# Patient Record
Sex: Male | Born: 1945
Health system: Southern US, Community
[De-identification: ages and names within clinical notes are randomized; demographics above are authoritative.]

## PROBLEM LIST (undated history)

## (undated) DIAGNOSIS — J449 Chronic obstructive pulmonary disease, unspecified: Secondary | ICD-10-CM

## (undated) DIAGNOSIS — E782 Mixed hyperlipidemia: Secondary | ICD-10-CM

## (undated) DIAGNOSIS — I10 Essential (primary) hypertension: Secondary | ICD-10-CM

## (undated) DIAGNOSIS — Z6828 Body mass index (BMI) 28.0-28.9, adult: Secondary | ICD-10-CM

## (undated) DIAGNOSIS — R002 Palpitations: Secondary | ICD-10-CM

## (undated) DIAGNOSIS — J439 Emphysema, unspecified: Secondary | ICD-10-CM

## (undated) DIAGNOSIS — I700 Atherosclerosis of aorta: Secondary | ICD-10-CM

## (undated) DIAGNOSIS — N529 Male erectile dysfunction, unspecified: Secondary | ICD-10-CM

## (undated) DIAGNOSIS — I714 Abdominal aortic aneurysm, without rupture, unspecified: Secondary | ICD-10-CM

## (undated) DIAGNOSIS — F17200 Nicotine dependence, unspecified, uncomplicated: Secondary | ICD-10-CM

## (undated) DIAGNOSIS — I7143 Infrarenal abdominal aortic aneurysm, without rupture: Secondary | ICD-10-CM

## (undated) DIAGNOSIS — I7123 Aneurysm of the descending thoracic aorta, without rupture: Secondary | ICD-10-CM

## (undated) DIAGNOSIS — I739 Peripheral vascular disease, unspecified: Secondary | ICD-10-CM

## (undated) HISTORY — DX: Emphysema, unspecified: J43.9

## (undated) HISTORY — DX: Infrarenal abdominal aortic aneurysm, without rupture: I71.43

## (undated) HISTORY — PX: HAND SURGERY: SHX662

## (undated) HISTORY — DX: Essential (primary) hypertension: I10

## (undated) HISTORY — DX: Chronic obstructive pulmonary disease, unspecified: J44.9

## (undated) HISTORY — DX: Male erectile dysfunction, unspecified: N52.9

## (undated) HISTORY — DX: Palpitations: R00.2

## (undated) HISTORY — DX: Atherosclerosis of aorta: I70.0

## (undated) HISTORY — DX: Mixed hyperlipidemia: E78.2

## (undated) HISTORY — DX: Peripheral vascular disease, unspecified: I73.9

## (undated) HISTORY — DX: Body mass index (BMI) 28.0-28.9, adult: Z68.28

## (undated) HISTORY — PX: COLONOSCOPY: SHX174

---

## 2008-05-31 ENCOUNTER — Ambulatory Visit (HOSPITAL_BASED_OUTPATIENT_CLINIC_OR_DEPARTMENT_OTHER): Admission: RE | Admit: 2008-05-31 | Discharge: 2008-05-31 | Payer: Self-pay | Admitting: Orthopedic Surgery

## 2008-05-31 ENCOUNTER — Encounter (INDEPENDENT_AMBULATORY_CARE_PROVIDER_SITE_OTHER): Payer: Self-pay | Admitting: Orthopedic Surgery

## 2010-07-05 LAB — POCT I-STAT, CHEM 8
BUN: 15 mg/dL (ref 6–23)
Chloride: 107 mEq/L (ref 96–112)
Creatinine, Ser: 1 mg/dL (ref 0.4–1.5)
Glucose, Bld: 121 mg/dL — ABNORMAL HIGH (ref 70–99)
Hemoglobin: 13.3 g/dL (ref 13.0–17.0)
Potassium: 3.6 mEq/L (ref 3.5–5.1)
Sodium: 140 mEq/L (ref 135–145)

## 2010-08-07 NOTE — Op Note (Signed)
Brian Ibarra, Brian NO.:  [id number]   MEDICAL RECORD NO.:  [id number]          PATIENT TYPE:  AMB   LOCATION:  DSC                          FACILITY:  [facility]   PHYSICIAN:  Katy Fitch. Sypher, M.D. DATE OF BIRTH:  Dec 23, 1945   DATE OF PROCEDURE:  05/31/2008  DATE OF DISCHARGE:                               OPERATIVE REPORT   PREOPERATIVE DIAGNOSES:  Severe neglected Dupuytren contracture  involving all digits left hand with greater than 90 degrees flexion  contractures of the long, ring, and small finger metacarpophalangeal  joints, greater than 100 degrees flexion contractures of the proximal  interphalangeal joints of the ring and small fingers, and about 85  degrees flexion fracture of the proximal interphalangeal joint of the  long finger and extensive thumb index webspace disease as well as radial  side thumb disease extending from the interphalangeal joint proximal to  the metacarpal head.   POSTOPERATIVE DIAGNOSES:  Severe neglected Dupuytren contracture  involving all digits left hand with greater than 90 degrees flexion  contractures of the long, ring, and small finger metacarpophalangeal  joints, greater than 100 degrees flexion contractures of the proximal  interphalangeal joints of the ring and small fingers, and about 85  degrees flexion fracture of the proximal interphalangeal joint of the  long finger and extensive thumb index webspace disease as well as radial  side thumb disease extending from the interphalangeal joint proximal to  the metacarpal head with minor underlying osteoarthritis limiting the  ability to fully extend the proximal interphalangeal joints of the ring  and small fingers.   OPERATIONS:  Resection of profoundly difficult Dupuytren contracture  from left hand involving:  1: small finger/palm, 2:  ring finger/palm,  3:  long finger/palm, index pretendinous fibers in palm, 4:  thumb index  webspace, and 5:  radial aspect of  thumb.   OPERATING SURGEON:  Katy Fitch. Sypher, MD   ASSISTANT:  Marveen Reeks Dasnoit, PA-C   ANESTHESIA:  General by LMA supplemented by a left infraclavicular  block.   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  He is approximately 15 years status post resection  of a complex Dupuytren contracture from the right hand.  He is a  comitted smoker.  Despite counseling by his medical physicians and  myself, he elects to continue smoking.  He understands that this  aggravates healing of wounds and may exacerbate his Dupuytren diathesis.   Brian Ibarra has profound Dupuytren contracture.  I performed extensive  resection surgery of the right hand and pinned his IP joints of the ring  and small fingers to control flexion contractures.  Postoperatively,  despite a modicum of therapy, he has been left with extension  contractures.   Frustrated by this predicament, he postponed surgery for left hand until  he had a closed fist predicament involving the long, ring, and small  fingers.  He subsequently presented for a detailed hand surgery consult  and we had two lengthy informed consents  regarding surgery aftercare  risks and benefits.  I outlined to him that smoking can complicate  healing.  With his severe diathesis, he will likely have recurrence over  time.  It is unlikely with these severe flexion contractures that will  be able to fully extend his fingers.   Cognizant of all these issues, he accepts a palliative procedure at this  time fully understanding that we are using mechanical means to correct  his flexion contractures that are due to a biological process we cannot  control.  Preoperatively, he was provided IV antibiotics in the form of  Ancef 1 gram.  He was interviewed by Dr. Katrinka Blazing and after informed  consent, an infraclavicular block placed without complication.   Brian Ibarra was then brought to room 2 of the [facility] and  placed in supine position on the operating table.  Under Dr. Michaelle Copas  direct supervision, general anesthesia by LMA technique was induced  followed by routine Betadine scrub and paint of left upper extremity.  A  pneumatic tourniquet was applied to the proximal brachium.  Following  exsanguination of the arm with an Esmarch bandage, the arterial  tourniquet was inflated to 250 mmHg.  The procedure commenced with  planning with extensor Brunner zigzag incisions.  The full-length of the  small finger, ring finger into the palm, the long finger, separate  incision in the palm for the long finger pretendinous fibers, incision  for the index finger pretendinous fibers, thumb index, webspace  incision, and radial aspect of thumb incision.   All skin flaps were meticulously elevated off the dermis.  A meticulous  dissection using 2 tourniquet times over 2 hours and 11 minutes with the  first tourniquet and 45 minutes for the second tourniquet were required  to meticulously remove all the pathologic fascia visible.  We allowed a  30 minute reperfusion between tourniquet episodes.   We were able to fully correct the metacarpophalangeal joint flexion  contractures.  The small finger PIP contracture was reduced to less than  25 degrees, the ring finger to approximately 10 degrees, long finger to  5 degrees,  index finger fully released, and the thumb fully released.  The tourniquet was released and hemostasis was achieved after each  tourniquet fashion with bipolar cautery.  The wounds were irrigated.  After hemostasis was achieved, the wounds were rinsed with sterile  saline followed by closure of the wounds loosely with corner sutures of  5-0 nylon and multiple interrupted sutures of 5-0 nylon.   Voluminous dressings were applied with Silvadene, Adaptic, sterile  gauze, sterile Webril, and a volar plaster splint maintaining the   fingers in about 50% extension.  There was immediate capillary refill  upon release of the first and second tourniquets.  All digits were well  perfused with normal turgor.  There were no apparent complications.   For aftercare, Mr. Lucas Ibarra was once again encouraged not smoke.  He is  provided prescriptions for Keflex 500 mg one p.o. q.8 h. x4 days as  prophylactic antibiotic.  He is also provided Motrin 600 mg one p.o. q.6  h. p.r.n. pain with food and also Percocet 5 mg 1 p.o. q.4-6 hours  p.r.n. pain, 30 tablets without refill.   We will see back for followup in 1 week or sooner if problems.  Katy Fitch Sypher, M.D.  Electronically Signed     RVS/MEDQ  D:  05/31/2008  T:  06/01/2008  Job:  [id number]   cc:   Luna Kitchens, M.D.

## 2018-11-25 DIAGNOSIS — L602 Onychogryphosis: Secondary | ICD-10-CM | POA: Insufficient documentation

## 2018-11-25 HISTORY — DX: Onychogryphosis: L60.2

## 2019-05-26 DIAGNOSIS — K635 Polyp of colon: Secondary | ICD-10-CM | POA: Diagnosis not present

## 2019-05-26 DIAGNOSIS — D124 Benign neoplasm of descending colon: Secondary | ICD-10-CM | POA: Diagnosis not present

## 2019-05-26 DIAGNOSIS — D123 Benign neoplasm of transverse colon: Secondary | ICD-10-CM | POA: Diagnosis not present

## 2019-05-26 DIAGNOSIS — Z86010 Personal history of colonic polyps: Secondary | ICD-10-CM | POA: Diagnosis not present

## 2019-05-26 DIAGNOSIS — Z800 Family history of malignant neoplasm of digestive organs: Secondary | ICD-10-CM | POA: Diagnosis not present

## 2019-05-26 DIAGNOSIS — Z1211 Encounter for screening for malignant neoplasm of colon: Secondary | ICD-10-CM | POA: Diagnosis not present

## 2019-05-26 DIAGNOSIS — K5730 Diverticulosis of large intestine without perforation or abscess without bleeding: Secondary | ICD-10-CM | POA: Diagnosis not present

## 2019-09-05 DIAGNOSIS — Z716 Tobacco abuse counseling: Secondary | ICD-10-CM | POA: Diagnosis not present

## 2019-09-05 DIAGNOSIS — A681 Tick-borne relapsing fever: Secondary | ICD-10-CM | POA: Diagnosis not present

## 2019-09-05 DIAGNOSIS — W57XXXA Bitten or stung by nonvenomous insect and other nonvenomous arthropods, initial encounter: Secondary | ICD-10-CM | POA: Diagnosis not present

## 2019-09-05 DIAGNOSIS — Z20828 Contact with and (suspected) exposure to other viral communicable diseases: Secondary | ICD-10-CM | POA: Diagnosis not present

## 2019-09-05 DIAGNOSIS — J449 Chronic obstructive pulmonary disease, unspecified: Secondary | ICD-10-CM | POA: Diagnosis not present

## 2019-09-15 DIAGNOSIS — Z79899 Other long term (current) drug therapy: Secondary | ICD-10-CM | POA: Diagnosis not present

## 2019-09-15 DIAGNOSIS — Z125 Encounter for screening for malignant neoplasm of prostate: Secondary | ICD-10-CM | POA: Diagnosis not present

## 2019-09-15 DIAGNOSIS — E782 Mixed hyperlipidemia: Secondary | ICD-10-CM | POA: Diagnosis not present

## 2019-09-15 DIAGNOSIS — I739 Peripheral vascular disease, unspecified: Secondary | ICD-10-CM | POA: Diagnosis not present

## 2019-09-15 DIAGNOSIS — I10 Essential (primary) hypertension: Secondary | ICD-10-CM | POA: Diagnosis not present

## 2019-09-15 DIAGNOSIS — N529 Male erectile dysfunction, unspecified: Secondary | ICD-10-CM | POA: Diagnosis not present

## 2019-09-15 DIAGNOSIS — F17200 Nicotine dependence, unspecified, uncomplicated: Secondary | ICD-10-CM | POA: Diagnosis not present

## 2019-09-15 DIAGNOSIS — J439 Emphysema, unspecified: Secondary | ICD-10-CM | POA: Diagnosis not present

## 2019-09-15 DIAGNOSIS — I712 Thoracic aortic aneurysm, without rupture: Secondary | ICD-10-CM | POA: Diagnosis not present

## 2019-12-17 DIAGNOSIS — Z87891 Personal history of nicotine dependence: Secondary | ICD-10-CM | POA: Diagnosis not present

## 2019-12-17 DIAGNOSIS — I712 Thoracic aortic aneurysm, without rupture: Secondary | ICD-10-CM | POA: Diagnosis not present

## 2019-12-17 DIAGNOSIS — I700 Atherosclerosis of aorta: Secondary | ICD-10-CM | POA: Diagnosis not present

## 2019-12-17 DIAGNOSIS — F17210 Nicotine dependence, cigarettes, uncomplicated: Secondary | ICD-10-CM | POA: Diagnosis not present

## 2019-12-17 DIAGNOSIS — I2510 Atherosclerotic heart disease of native coronary artery without angina pectoris: Secondary | ICD-10-CM | POA: Diagnosis not present

## 2019-12-17 DIAGNOSIS — J432 Centrilobular emphysema: Secondary | ICD-10-CM | POA: Diagnosis not present

## 2020-01-11 ENCOUNTER — Encounter: Payer: Self-pay | Admitting: Vascular Surgery

## 2020-01-11 ENCOUNTER — Encounter: Payer: Self-pay | Admitting: *Deleted

## 2020-01-11 ENCOUNTER — Other Ambulatory Visit: Payer: Self-pay

## 2020-01-11 ENCOUNTER — Ambulatory Visit: Payer: Medicare PPO | Admitting: Vascular Surgery

## 2020-01-11 DIAGNOSIS — I712 Thoracic aortic aneurysm, without rupture: Secondary | ICD-10-CM

## 2020-01-11 DIAGNOSIS — I7123 Aneurysm of the descending thoracic aorta, without rupture: Secondary | ICD-10-CM | POA: Insufficient documentation

## 2020-01-11 NOTE — Progress Notes (Signed)
Patient name: Brian Ibarra MRN: [id number] DOB: 1946/03/22 Sex: male  REASON FOR CONSULT: Descending thoracic aneurysm  HPI: Brian Ibarra is a 74 y.o. male, with history of COPD, hypertension, tobacco abuse that presents as a referral for evaluation of a descending thoracic aortic aneurysm.  Patient states he has known about the aneurysm for at least the last 4 to 5 years.  This has been followed by his primary care doctor Dr. Welton Flakes in [place].  His most recent CT done at [facility] on 12/17/2019 suggest a 4.3 cm fusiform aneurysm of the proximal descending thoracic aorta that is unchanged.  Also posterior small focal penetrating PAU.  Patient denies any chest pain or back pain.  He is still smoking about a pack a day and has smoked for 60 years.  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  States he has had lower extremity angiogram with IR in [place] for claudication.  His legs do not bother him at this time.    Past Medical History:  Diagnosis Date  . COPD (chronic obstructive pulmonary disease) (HCC)   . Hypertension    PSH: None  Family History  Problem Relation Age of Onset  . Heart disease Mother   . Cancer Mother   . Cancer Father     SOCIAL HISTORY: Social History   Socioeconomic History  . Marital status: Divorced    Spouse name: Not on file  . Number of children: Not on file  . Years of education: Not on file  . Highest education level: Not on file  Occupational History  . Not on file  Tobacco Use  . Smoking status: Current Every Day Smoker    Types: Cigarettes  . Smokeless tobacco: Former Engineer, water and Sexual Activity  . Alcohol use: Yes  . Drug use: Never  . Sexual activity: Not on file  Other Topics Concern  . Not on file  Social History Narrative  . Not on file   Social Determinants of Health   Financial Resource Strain:   . Difficulty of Paying Living Expenses: Not on file  Food Insecurity:   . Worried About Programme researcher, broadcasting/film/video  in the Last Year: Not on file  . Ran Out of Food in the Last Year: Not on file  Transportation Needs:   . Lack of Transportation (Medical): Not on file  . Lack of Transportation (Non-Medical): Not on file  Physical Activity:   . Days of Exercise per Week: Not on file  . Minutes of Exercise per Session: Not on file  Stress:   . Feeling of Stress : Not on file  Social Connections:   . Frequency of Communication with Friends and Family: Not on file  . Frequency of Social Gatherings with Friends and Family: Not on file  . Attends Religious Services: Not on file  . Active Member of Clubs or Organizations: Not on file  . Attends Banker Meetings: Not on file  . Marital Status: Not on file  Intimate Partner Violence:   . Fear of Current or Ex-Partner: Not on file  . Emotionally Abused: Not on file  . Physically Abused: Not on file  . Sexually Abused: Not on file    No Known Allergies  Current Outpatient Medications  Medication Sig Dispense Refill  . albuterol (VENTOLIN HFA) 108 (90 Base) MCG/ACT inhaler INHALE TWO PUFFS FOUR TIMES DAILY AS NEEDED.    Marland Kitchen amLODipine-olmesartan (AZOR) 5-20 MG tablet Take 1 tablet  by mouth daily.    Marland Kitchen aspirin 81 MG EC tablet Take by mouth.    Marland Kitchen atorvastatin (LIPITOR) 20 MG tablet     . clopidogrel (PLAVIX) 75 MG tablet Take 75 mg by mouth daily.     No current facility-administered medications for this visit.    REVIEW OF SYSTEMS:  [X]  denotes positive finding, [ ]  denotes negative finding Cardiac  Comments:  Chest pain or chest pressure:    Shortness of breath upon exertion:    Short of breath when lying flat:    Irregular heart rhythm:        Vascular    Pain in calf, thigh, or hip brought on by ambulation:    Pain in feet at night that wakes you up from your sleep:     Blood clot in your veins:    Leg swelling:         Pulmonary    Oxygen at home:    Productive cough:     Wheezing:         Neurologic    Sudden weakness in  arms or legs:     Sudden numbness in arms or legs:     Sudden onset of difficulty speaking or slurred speech:    Temporary loss of vision in one eye:     Problems with dizziness:         Gastrointestinal    Blood in stool:     Vomited blood:         Genitourinary    Burning when urinating:     Blood in urine:        Psychiatric    Major depression:         Hematologic    Bleeding problems:    Problems with blood clotting too easily:        Skin    Rashes or ulcers:        Constitutional    Fever or chills:      PHYSICAL EXAM: Vitals:   01/11/20 1133  BP: (!) 181/98  Pulse: 77  Resp: 18  Temp: 98 F (36.7 C)  TempSrc: Temporal  SpO2: 94%  Weight: 213 lb (96.6 kg)  Height: 6\' 2"  (1.88 m)    GENERAL: The patient is a well-nourished male, in no acute distress. The vital signs are documented above. CARDIAC: There is a regular rate and rhythm.  VASCULAR:  Palpable femoral pulses bilateral groins Right DP 1+ palpable Unable to appreciate pedal pulses in the left but warm PULMONARY: No respiratory distress. ABDOMEN: Soft and non-tender with normal pitched bowel sounds.  MUSCULOSKELETAL: There are no major deformities or cyanosis. NEUROLOGIC: No focal weakness or paresthesias are detected. SKIN: There are no ulcers or rashes noted. PSYCHIATRIC: The patient has a normal affect.  DATA:   I do not have the images but per the report from [facility] there is a unchanged fusiform aneurysm of the proximal descending thoracic aorta measuring 4.3 cm.  Assessment/Plan:  74 year old male referred for a 4.3 cm aneurysm of the descending thoracic aorta.  I do not have the images but I have the report from [facility] at [facility] and states this is unchanged.  I have asked our office to get a copy of the images from [facility] so I can review the images.  Discussed current SVS guidelines are to repair greater than 5.5 cm in low surgical risk  patients and generally 6 cm in higher risk patients.  Discussed with the patient no indication for repair at this time and will follow.  I think can repeat a CTA chest in 1 year.  Also his wife is very concerned about other aneurysmal disease and discussed that with his smoking history would qualify for Medicare screening exam to rule out abdominal aortic aneurysm which I ordered.  I will contact him with results.   [name], MD [facility] [place] Office: [phone]

## 2020-01-17 ENCOUNTER — Telehealth: Payer: Self-pay | Admitting: *Deleted

## 2020-01-17 ENCOUNTER — Other Ambulatory Visit: Payer: Self-pay

## 2020-01-17 DIAGNOSIS — I7123 Aneurysm of the descending thoracic aorta, without rupture: Secondary | ICD-10-CM

## 2020-01-17 DIAGNOSIS — I712 Thoracic aortic aneurysm, without rupture: Secondary | ICD-10-CM

## 2020-01-17 NOTE — Telephone Encounter (Signed)
Pt called to inquire about where to get extra strength gas ex. Informed pt it can be found over the counter at most pharmacies and to ask the pharmacist if he is having trouble finding it.

## 2020-01-19 ENCOUNTER — Other Ambulatory Visit: Payer: Self-pay

## 2020-01-19 ENCOUNTER — Ambulatory Visit (HOSPITAL_COMMUNITY)
Admission: RE | Admit: 2020-01-19 | Discharge: 2020-01-19 | Disposition: A | Discharge status: Home / Self Care | Payer: Medicare PPO | Source: Ambulatory Visit | Attending: Vascular Surgery | Admitting: Vascular Surgery

## 2020-01-19 DIAGNOSIS — I10 Essential (primary) hypertension: Secondary | ICD-10-CM | POA: Insufficient documentation

## 2020-01-19 DIAGNOSIS — I712 Thoracic aortic aneurysm, without rupture: Secondary | ICD-10-CM | POA: Diagnosis not present

## 2020-01-19 DIAGNOSIS — Z136 Encounter for screening for cardiovascular disorders: Secondary | ICD-10-CM | POA: Diagnosis not present

## 2020-01-19 DIAGNOSIS — F17200 Nicotine dependence, unspecified, uncomplicated: Secondary | ICD-10-CM | POA: Insufficient documentation

## 2020-01-19 DIAGNOSIS — I7123 Aneurysm of the descending thoracic aorta, without rupture: Secondary | ICD-10-CM

## 2020-01-19 DIAGNOSIS — F17210 Nicotine dependence, cigarettes, uncomplicated: Secondary | ICD-10-CM | POA: Diagnosis not present

## 2020-02-01 ENCOUNTER — Encounter: Payer: Medicare PPO | Admitting: Vascular Surgery

## 2020-02-02 ENCOUNTER — Telehealth: Payer: Self-pay | Admitting: Vascular Surgery

## 2020-02-02 NOTE — Telephone Encounter (Signed)
Discussed with Brian Ibarra that his AAA screen did confirm a 4.5 cm abdominal aortic aneurysm.  We discussed that recommendation would be surveillance at this size and indication for repair greater than 5.5 cm unless there is rapid growth in the aneurysm or he starts having symptoms coming from the aneurysm.  Discussed I would plan to add on EVAR duplex in 1 year at the time that he scheduled to follow-up with me with CT chest for surveillance of thoracic aneurysm.  He would like to have a CT scan done in [place] which we will hopefully facilitate at his next appointment.  [name], MD [facility] [place] Office: [phone]   [name]

## 2020-03-19 DIAGNOSIS — B349 Viral infection, unspecified: Secondary | ICD-10-CM | POA: Diagnosis not present

## 2020-03-19 DIAGNOSIS — Z20828 Contact with and (suspected) exposure to other viral communicable diseases: Secondary | ICD-10-CM | POA: Diagnosis not present

## 2020-03-19 DIAGNOSIS — R051 Acute cough: Secondary | ICD-10-CM | POA: Diagnosis not present

## 2020-10-09 DIAGNOSIS — I10 Essential (primary) hypertension: Secondary | ICD-10-CM | POA: Diagnosis not present

## 2020-10-09 DIAGNOSIS — Z6827 Body mass index (BMI) 27.0-27.9, adult: Secondary | ICD-10-CM | POA: Diagnosis not present

## 2020-10-09 DIAGNOSIS — Z125 Encounter for screening for malignant neoplasm of prostate: Secondary | ICD-10-CM | POA: Diagnosis not present

## 2020-10-09 DIAGNOSIS — I739 Peripheral vascular disease, unspecified: Secondary | ICD-10-CM | POA: Diagnosis not present

## 2020-10-09 DIAGNOSIS — J439 Emphysema, unspecified: Secondary | ICD-10-CM | POA: Diagnosis not present

## 2020-10-09 DIAGNOSIS — F17200 Nicotine dependence, unspecified, uncomplicated: Secondary | ICD-10-CM | POA: Diagnosis not present

## 2020-10-09 DIAGNOSIS — Z79899 Other long term (current) drug therapy: Secondary | ICD-10-CM | POA: Diagnosis not present

## 2020-10-09 DIAGNOSIS — Z0000 Encounter for general adult medical examination without abnormal findings: Secondary | ICD-10-CM | POA: Diagnosis not present

## 2020-10-09 DIAGNOSIS — E782 Mixed hyperlipidemia: Secondary | ICD-10-CM | POA: Diagnosis not present

## 2020-10-20 DIAGNOSIS — Z6827 Body mass index (BMI) 27.0-27.9, adult: Secondary | ICD-10-CM | POA: Diagnosis not present

## 2020-10-20 DIAGNOSIS — M7022 Olecranon bursitis, left elbow: Secondary | ICD-10-CM | POA: Diagnosis not present

## 2020-10-30 DIAGNOSIS — M720 Palmar fascial fibromatosis [Dupuytren]: Secondary | ICD-10-CM | POA: Diagnosis not present

## 2020-10-30 DIAGNOSIS — M79642 Pain in left hand: Secondary | ICD-10-CM | POA: Diagnosis not present

## 2020-12-13 ENCOUNTER — Other Ambulatory Visit: Payer: Self-pay

## 2020-12-13 DIAGNOSIS — I712 Thoracic aortic aneurysm, without rupture, unspecified: Secondary | ICD-10-CM

## 2020-12-27 ENCOUNTER — Ambulatory Visit (HOSPITAL_COMMUNITY)
Admission: RE | Admit: 2020-12-27 | Discharge: 2020-12-27 | Disposition: A | Discharge status: Home / Self Care | Payer: Medicare PPO | Source: Ambulatory Visit | Attending: Vascular Surgery | Admitting: Vascular Surgery

## 2020-12-27 ENCOUNTER — Other Ambulatory Visit: Payer: Self-pay

## 2020-12-27 ENCOUNTER — Encounter (HOSPITAL_COMMUNITY): Payer: Self-pay

## 2020-12-27 DIAGNOSIS — I712 Thoracic aortic aneurysm, without rupture, unspecified: Secondary | ICD-10-CM

## 2020-12-27 DIAGNOSIS — J9811 Atelectasis: Secondary | ICD-10-CM | POA: Diagnosis not present

## 2020-12-27 LAB — POCT I-STAT CREATININE: Creatinine, Ser: 0.8 mg/dL (ref 0.61–1.24)

## 2020-12-27 MED ORDER — IOHEXOL 350 MG/ML SOLN
80.0000 mL | Freq: Once | INTRAVENOUS | Status: AC | PRN
  Administered 2020-12-27: 80 mL via INTRAVENOUS

## 2020-12-31 ENCOUNTER — Other Ambulatory Visit: Payer: Self-pay

## 2020-12-31 DIAGNOSIS — I7121 Aneurysm of the ascending aorta, without rupture: Secondary | ICD-10-CM

## 2021-01-16 ENCOUNTER — Encounter: Payer: Self-pay | Admitting: Vascular Surgery

## 2021-01-16 ENCOUNTER — Other Ambulatory Visit: Payer: Self-pay | Admitting: Vascular Surgery

## 2021-01-16 ENCOUNTER — Other Ambulatory Visit: Payer: Self-pay

## 2021-01-16 ENCOUNTER — Ambulatory Visit: Payer: Medicare PPO | Admitting: Vascular Surgery

## 2021-01-16 ENCOUNTER — Ambulatory Visit (HOSPITAL_COMMUNITY)
Admission: RE | Admit: 2021-01-16 | Discharge: 2021-01-16 | Disposition: A | Discharge status: Home / Self Care | Payer: Medicare PPO | Source: Ambulatory Visit | Attending: Vascular Surgery | Admitting: Vascular Surgery

## 2021-01-16 VITALS — BP 178/76 | HR 85 | Temp 97.8°F | Resp 20 | Ht 74.0 in | Wt 213.0 lb

## 2021-01-16 DIAGNOSIS — I7123 Aneurysm of the descending thoracic aorta, without rupture: Secondary | ICD-10-CM | POA: Diagnosis not present

## 2021-01-16 DIAGNOSIS — I7143 Infrarenal abdominal aortic aneurysm, without rupture: Secondary | ICD-10-CM | POA: Diagnosis not present

## 2021-01-16 DIAGNOSIS — I7121 Aneurysm of the ascending aorta, without rupture: Secondary | ICD-10-CM | POA: Diagnosis not present

## 2021-01-16 DIAGNOSIS — I714 Abdominal aortic aneurysm, without rupture, unspecified: Secondary | ICD-10-CM

## 2021-01-16 HISTORY — DX: Abdominal aortic aneurysm, without rupture, unspecified: I71.40

## 2021-01-16 NOTE — Progress Notes (Signed)
Patient name: Brian Ibarra MRN: [id number] DOB: 1945/07/09 Sex: male  REASON FOR CONSULT: 1 year follow-up for 4.3 cm descending thoracic aneurysm and 4.5 cm abdominal aortic aneurysm  HPI: Brian Ibarra is a 75 y.o. male, with history of COPD, hypertension, tobacco abuse that presents for 1 year follow-up of his 4.3 cm descending thoracic aneurysm and 4.5 cm abdominal aortic aneurysm.  He has no new complaints today other than some left fifth toe pain that bothers him intermittently.  He still smoking about a pack to pack and half a day. 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  No chest pain.  No abdominal or back pain.  Past Medical History:  Diagnosis Date   COPD (chronic obstructive pulmonary disease) (HCC)    Hypertension    PSH: None  Family History  Problem Relation Age of Onset   Heart disease Mother    Cancer Mother    Cancer Father     SOCIAL HISTORY: Social History   Socioeconomic History   Marital status: Divorced    Spouse name: Not on file   Number of children: Not on file   Years of education: Not on file   Highest education level: Not on file  Occupational History   Not on file  Tobacco Use   Smoking status: Every Day    Types: Cigarettes   Smokeless tobacco: Former  Substance and Sexual Activity   Alcohol use: Yes   Drug use: Never   Sexual activity: Not on file  Other Topics Concern   Not on file  Social History Narrative   Not on file   Social Determinants of Health   Financial Resource Strain: Not on file  Food Insecurity: Not on file  Transportation Needs: Not on file  Physical Activity: Not on file  Stress: Not on file  Social Connections: Not on file  Intimate Partner Violence: Not on file    No Known Allergies  Current Outpatient Medications  Medication Sig Dispense Refill   albuterol (VENTOLIN HFA) 108 (90 Base) MCG/ACT inhaler INHALE TWO PUFFS FOUR TIMES DAILY AS NEEDED.     amLODipine-olmesartan (AZOR) 5-20 MG  tablet Take 1 tablet by mouth daily.     aspirin 81 MG EC tablet Take by mouth.     atorvastatin (LIPITOR) 20 MG tablet      clopidogrel (PLAVIX) 75 MG tablet Take 75 mg by mouth daily.     No current facility-administered medications for this visit.    REVIEW OF SYSTEMS:  [X]  denotes positive finding, [ ]  denotes negative finding Cardiac  Comments:  Chest pain or chest pressure:    Shortness of breath upon exertion:    Short of breath when lying flat:    Irregular heart rhythm:        Vascular    Pain in calf, thigh, or hip brought on by ambulation:    Pain in feet at night that wakes you up from your sleep:     Blood clot in your veins:    Leg swelling:         Pulmonary    Oxygen at home:    Productive cough:     Wheezing:         Neurologic    Sudden weakness in arms or legs:     Sudden numbness in arms or legs:     Sudden onset of difficulty speaking or slurred speech:    Temporary loss of vision in one eye:  Problems with dizziness:         Gastrointestinal    Blood in stool:     Vomited blood:         Genitourinary    Burning when urinating:     Blood in urine:        Psychiatric    Major depression:         Hematologic    Bleeding problems:    Problems with blood clotting too easily:        Skin    Rashes or ulcers:        Constitutional    Fever or chills:      PHYSICAL EXAM: Vitals:   01/16/21 0911  BP: (!) 178/76  Pulse: 85  Resp: 20  Temp: 97.8 F (36.6 C)  TempSrc: Temporal  SpO2: 93%  Weight: 213 lb (96.6 kg)  Height: 6\' 2"  (1.88 m)    GENERAL: The patient is a well-nourished male, in no acute distress. The vital signs are documented above. CARDIAC: There is a regular rate and rhythm.  VASCULAR:  Palpable femoral pulses bilateral groins Right DP 1+ palpable Unable to appreciate pedal pulses in the left but good DP PT Doppler signals PULMONARY: No respiratory distress. ABDOMEN: Soft and non-tender.  No pain with deep  palpation. MUSCULOSKELETAL: There are no major deformities or cyanosis. NEUROLOGIC: No focal weakness or paresthesias are detected. SKIN: There are no ulcers or rashes noted. PSYCHIATRIC: The patient has a normal affect.  DATA:   CTA chest 12/27/2020 on my review shows stable descending thoracic aneurysm measuring 4.5 cm.  AAA duplex today shows slight increase in AAA from 4.5 cm to 4.6 cm  Assessment/Plan:  75 year old male presents for 1 year follow-up of a descending thoracic aneurysm and an abdominal aortic aneurysm.  I discussed with him that his thoracic aneurysm looks relatively stable at 4.5 cm.  No indication for repair unless greater than usually 6 cm.  His abdominal aortic aneurysm has also shown minimal growth now measuring 4.6 cm.  Discussed indication for repair is greater than 5.5 cm unless rapid growth.  We will see him in 1 year with CTA chest and AAA duplex for ongoing surveillance.   67, MD [facility] [place] Office: [phone]

## 2021-02-03 DIAGNOSIS — R0600 Dyspnea, unspecified: Secondary | ICD-10-CM | POA: Diagnosis not present

## 2021-02-03 DIAGNOSIS — I10 Essential (primary) hypertension: Secondary | ICD-10-CM | POA: Diagnosis not present

## 2021-02-03 DIAGNOSIS — R079 Chest pain, unspecified: Secondary | ICD-10-CM | POA: Diagnosis not present

## 2021-02-03 DIAGNOSIS — R0789 Other chest pain: Secondary | ICD-10-CM | POA: Diagnosis not present

## 2021-02-03 DIAGNOSIS — F419 Anxiety disorder, unspecified: Secondary | ICD-10-CM | POA: Diagnosis not present

## 2021-02-03 DIAGNOSIS — R0602 Shortness of breath: Secondary | ICD-10-CM | POA: Diagnosis not present

## 2021-02-03 DIAGNOSIS — J449 Chronic obstructive pulmonary disease, unspecified: Secondary | ICD-10-CM | POA: Diagnosis not present

## 2021-02-08 DIAGNOSIS — I7123 Aneurysm of the descending thoracic aorta, without rupture: Secondary | ICD-10-CM | POA: Diagnosis not present

## 2021-02-08 DIAGNOSIS — I10 Essential (primary) hypertension: Secondary | ICD-10-CM | POA: Diagnosis not present

## 2021-02-08 DIAGNOSIS — Z6828 Body mass index (BMI) 28.0-28.9, adult: Secondary | ICD-10-CM | POA: Diagnosis not present

## 2021-02-08 DIAGNOSIS — F418 Other specified anxiety disorders: Secondary | ICD-10-CM | POA: Diagnosis not present

## 2021-02-08 DIAGNOSIS — J439 Emphysema, unspecified: Secondary | ICD-10-CM | POA: Diagnosis not present

## 2021-02-08 DIAGNOSIS — I7143 Infrarenal abdominal aortic aneurysm, without rupture: Secondary | ICD-10-CM | POA: Diagnosis not present

## 2021-02-08 DIAGNOSIS — E782 Mixed hyperlipidemia: Secondary | ICD-10-CM | POA: Diagnosis not present

## 2021-03-07 DIAGNOSIS — M720 Palmar fascial fibromatosis [Dupuytren]: Secondary | ICD-10-CM | POA: Diagnosis not present

## 2021-03-13 ENCOUNTER — Other Ambulatory Visit: Payer: Self-pay | Admitting: Orthopedic Surgery

## 2021-03-20 DIAGNOSIS — M720 Palmar fascial fibromatosis [Dupuytren]: Secondary | ICD-10-CM | POA: Diagnosis not present

## 2021-03-20 DIAGNOSIS — Z6828 Body mass index (BMI) 28.0-28.9, adult: Secondary | ICD-10-CM | POA: Diagnosis not present

## 2021-03-20 DIAGNOSIS — J439 Emphysema, unspecified: Secondary | ICD-10-CM | POA: Diagnosis not present

## 2021-03-28 ENCOUNTER — Encounter (HOSPITAL_BASED_OUTPATIENT_CLINIC_OR_DEPARTMENT_OTHER): Payer: Self-pay | Admitting: Orthopedic Surgery

## 2021-03-28 ENCOUNTER — Other Ambulatory Visit: Payer: Self-pay

## 2021-03-28 NOTE — Progress Notes (Signed)
Chart reviewed with Dr Clemens Catholic concerning thoracic and abdominal aneurysms, OK for [facility].

## 2021-04-02 ENCOUNTER — Encounter (HOSPITAL_BASED_OUTPATIENT_CLINIC_OR_DEPARTMENT_OTHER)
Admission: RE | Admit: 2021-04-02 | Discharge: 2021-04-02 | Disposition: A | Discharge status: Home / Self Care | Payer: Medicare PPO | Source: Ambulatory Visit | Attending: Orthopedic Surgery | Admitting: Orthopedic Surgery

## 2021-04-02 DIAGNOSIS — Z01810 Encounter for preprocedural cardiovascular examination: Secondary | ICD-10-CM | POA: Diagnosis not present

## 2021-04-02 NOTE — Progress Notes (Signed)

## 2021-04-05 ENCOUNTER — Other Ambulatory Visit: Payer: Self-pay

## 2021-04-05 ENCOUNTER — Ambulatory Visit (HOSPITAL_BASED_OUTPATIENT_CLINIC_OR_DEPARTMENT_OTHER): Payer: Medicare PPO | Admitting: Anesthesiology

## 2021-04-05 ENCOUNTER — Encounter (HOSPITAL_BASED_OUTPATIENT_CLINIC_OR_DEPARTMENT_OTHER)
Admission: RE | Disposition: A | Discharge status: Home / Self Care | Payer: Self-pay | Source: Home / Self Care | Attending: Orthopedic Surgery

## 2021-04-05 ENCOUNTER — Ambulatory Visit (HOSPITAL_BASED_OUTPATIENT_CLINIC_OR_DEPARTMENT_OTHER)
Admission: RE | Admit: 2021-04-05 | Discharge: 2021-04-05 | Disposition: A | Discharge status: Home / Self Care | Payer: Medicare PPO | Attending: Orthopedic Surgery | Admitting: Orthopedic Surgery

## 2021-04-05 ENCOUNTER — Encounter (HOSPITAL_BASED_OUTPATIENT_CLINIC_OR_DEPARTMENT_OTHER): Payer: Self-pay | Admitting: Orthopedic Surgery

## 2021-04-05 DIAGNOSIS — I10 Essential (primary) hypertension: Secondary | ICD-10-CM | POA: Diagnosis not present

## 2021-04-05 DIAGNOSIS — M720 Palmar fascial fibromatosis [Dupuytren]: Secondary | ICD-10-CM | POA: Insufficient documentation

## 2021-04-05 DIAGNOSIS — Z79899 Other long term (current) drug therapy: Secondary | ICD-10-CM | POA: Insufficient documentation

## 2021-04-05 DIAGNOSIS — I493 Ventricular premature depolarization: Secondary | ICD-10-CM | POA: Insufficient documentation

## 2021-04-05 DIAGNOSIS — Z7902 Long term (current) use of antithrombotics/antiplatelets: Secondary | ICD-10-CM | POA: Diagnosis not present

## 2021-04-05 DIAGNOSIS — J449 Chronic obstructive pulmonary disease, unspecified: Secondary | ICD-10-CM | POA: Diagnosis not present

## 2021-04-05 DIAGNOSIS — F17210 Nicotine dependence, cigarettes, uncomplicated: Secondary | ICD-10-CM | POA: Insufficient documentation

## 2021-04-05 DIAGNOSIS — I739 Peripheral vascular disease, unspecified: Secondary | ICD-10-CM | POA: Diagnosis not present

## 2021-04-05 HISTORY — PX: DUPUYTREN CONTRACTURE RELEASE: SHX1478

## 2021-04-05 HISTORY — DX: Nicotine dependence, unspecified, uncomplicated: F17.200

## 2021-04-05 HISTORY — DX: Abdominal aortic aneurysm, without rupture, unspecified: I71.40

## 2021-04-05 HISTORY — DX: Aneurysm of the descending thoracic aorta, without rupture: I71.23

## 2021-04-05 SURGERY — RELEASE, DUPUYTREN CONTRACTURE
Anesthesia: Regional | Site: Hand | Laterality: Right

## 2021-04-05 MED ORDER — ONDANSETRON HCL 4 MG/2ML IJ SOLN
INTRAMUSCULAR | Status: DC | PRN
Start: 2021-04-05 — End: 2021-04-05
  Administered 2021-04-05: 4 mg via INTRAVENOUS

## 2021-04-05 MED ORDER — THROMBIN 5000 UNITS EX SOLR
CUTANEOUS | Status: AC
  Filled 2021-04-05: qty 5000

## 2021-04-05 MED ORDER — MIDAZOLAM HCL 5 MG/5ML IJ SOLN
INTRAMUSCULAR | Status: DC | PRN
  Administered 2021-04-05: 1 mg via INTRAVENOUS

## 2021-04-05 MED ORDER — THROMBIN 5000 UNITS EX SOLR
CUTANEOUS | Status: DC | PRN
  Administered 2021-04-05: 5000 [IU] via TOPICAL

## 2021-04-05 MED ORDER — 0.9 % SODIUM CHLORIDE (POUR BTL) OPTIME
TOPICAL | Status: DC | PRN
  Administered 2021-04-05: 150 mL

## 2021-04-05 MED ORDER — CLONIDINE HCL (ANALGESIA) 100 MCG/ML EP SOLN
EPIDURAL | Status: DC | PRN
Start: 2021-04-05 — End: 2021-04-05
  Administered 2021-04-05: 100 ug

## 2021-04-05 MED ORDER — ONDANSETRON HCL 4 MG/2ML IJ SOLN: 4.0000 mg | Freq: Once | INTRAMUSCULAR | Status: DC | PRN

## 2021-04-05 MED ORDER — MIDAZOLAM HCL 2 MG/2ML IJ SOLN
INTRAMUSCULAR | Status: AC
  Filled 2021-04-05: qty 2

## 2021-04-05 MED ORDER — FENTANYL CITRATE (PF) 100 MCG/2ML IJ SOLN
INTRAMUSCULAR | Status: AC
  Filled 2021-04-05: qty 2

## 2021-04-05 MED ORDER — ROPIVACAINE HCL 7.5 MG/ML IJ SOLN
INTRAMUSCULAR | Status: DC | PRN
Start: 2021-04-05 — End: 2021-04-05
  Administered 2021-04-05: 20 mL via PERINEURAL

## 2021-04-05 MED ORDER — CEFAZOLIN SODIUM-DEXTROSE 2-4 GM/100ML-% IV SOLN
INTRAVENOUS | Status: AC
  Filled 2021-04-05: qty 100

## 2021-04-05 MED ORDER — PROPOFOL 500 MG/50ML IV EMUL
INTRAVENOUS | Status: AC
  Filled 2021-04-05: qty 50

## 2021-04-05 MED ORDER — PROPOFOL 500 MG/50ML IV EMUL
INTRAVENOUS | Status: DC | PRN
  Administered 2021-04-05: 50 ug/kg/min via INTRAVENOUS

## 2021-04-05 MED ORDER — FENTANYL CITRATE (PF) 100 MCG/2ML IJ SOLN
INTRAMUSCULAR | Status: DC | PRN
  Administered 2021-04-05: 25 ug via INTRAVENOUS

## 2021-04-05 MED ORDER — HYDROCODONE-ACETAMINOPHEN 5-325 MG PO TABS: ORAL_TABLET | ORAL | 0 refills | Status: DC

## 2021-04-05 MED ORDER — MIDAZOLAM HCL 2 MG/2ML IJ SOLN
2.0000 mg | Freq: Once | INTRAMUSCULAR | Status: AC
  Administered 2021-04-05: 1 mg via INTRAVENOUS

## 2021-04-05 MED ORDER — LACTATED RINGERS IV SOLN: INTRAVENOUS | Status: DC

## 2021-04-05 MED ORDER — FENTANYL CITRATE (PF) 100 MCG/2ML IJ SOLN
100.0000 ug | Freq: Once | INTRAMUSCULAR | Status: AC
  Administered 2021-04-05: 50 ug via INTRAVENOUS

## 2021-04-05 MED ORDER — ACETAMINOPHEN 10 MG/ML IV SOLN: 1000.0000 mg | Freq: Once | INTRAVENOUS | Status: DC | PRN

## 2021-04-05 MED ORDER — CEFAZOLIN SODIUM-DEXTROSE 2-4 GM/100ML-% IV SOLN
2.0000 g | INTRAVENOUS | Status: AC
  Administered 2021-04-05: 2 g via INTRAVENOUS

## 2021-04-05 MED ORDER — FENTANYL CITRATE (PF) 100 MCG/2ML IJ SOLN: 25.0000 ug | INTRAMUSCULAR | Status: DC | PRN

## 2021-04-05 SURGICAL SUPPLY — 47 items
APL PRP STRL LF DISP 70% ISPRP (MISCELLANEOUS) ×1
BLADE MINI RND TIP GREEN BEAV (BLADE) ×1 IMPLANT
BLADE SURG 15 STRL LF DISP TIS (BLADE) ×2 IMPLANT
BLADE SURG 15 STRL SS (BLADE) ×4
BNDG CMPR 9X4 STRL LF SNTH (GAUZE/BANDAGES/DRESSINGS) ×1
BNDG ELASTIC 3X5.8 VLCR STR LF (GAUZE/BANDAGES/DRESSINGS) ×2 IMPLANT
BNDG ESMARK 4X9 LF (GAUZE/BANDAGES/DRESSINGS) ×1 IMPLANT
BNDG GAUZE ELAST 4 BULKY (GAUZE/BANDAGES/DRESSINGS) ×2 IMPLANT
CHLORAPREP W/TINT 26 (MISCELLANEOUS) ×2 IMPLANT
CORD BIPOLAR FORCEPS 12FT (ELECTRODE) ×2 IMPLANT
COVER BACK TABLE 60X90IN (DRAPES) ×2 IMPLANT
COVER MAYO STAND STRL (DRAPES) ×2 IMPLANT
CUFF TOURN SGL QUICK 18 NS (TOURNIQUET CUFF) ×1 IMPLANT
CUFF TOURN SGL QUICK 18X4 (TOURNIQUET CUFF) ×1 IMPLANT
DRAPE EXTREMITY T 121X128X90 (DISPOSABLE) ×2 IMPLANT
DRAPE SURG 17X23 STRL (DRAPES) ×2 IMPLANT
GAUZE 4X4 16PLY ~~LOC~~+RFID DBL (SPONGE) ×1 IMPLANT
GAUZE SPONGE 4X4 12PLY STRL (GAUZE/BANDAGES/DRESSINGS) ×2 IMPLANT
GAUZE XEROFORM 1X8 LF (GAUZE/BANDAGES/DRESSINGS) ×2 IMPLANT
GLOVE SRG 8 PF TXTR STRL LF DI (GLOVE) IMPLANT
GLOVE SURG ENC MOIS LTX SZ7.5 (GLOVE) ×2 IMPLANT
GLOVE SURG ORTHO LTX SZ8 (GLOVE) ×1 IMPLANT
GLOVE SURG POLYISO LF SZ7 (GLOVE) ×1 IMPLANT
GLOVE SURG UNDER POLY LF SZ7 (GLOVE) ×2 IMPLANT
GLOVE SURG UNDER POLY LF SZ8 (GLOVE) ×2
GLOVE SURG UNDER POLY LF SZ8.5 (GLOVE) ×1 IMPLANT
GOWN STRL REUS W/ TWL LRG LVL3 (GOWN DISPOSABLE) ×1 IMPLANT
GOWN STRL REUS W/TWL LRG LVL3 (GOWN DISPOSABLE) ×2
GOWN STRL REUS W/TWL XL LVL3 (GOWN DISPOSABLE) ×3 IMPLANT
LOOP VESSEL MAXI BLUE (MISCELLANEOUS) ×1 IMPLANT
NDL HYPO 25X1 1.5 SAFETY (NEEDLE) ×1 IMPLANT
NEEDLE HYPO 25X1 1.5 SAFETY (NEEDLE) IMPLANT
NS IRRIG 1000ML POUR BTL (IV SOLUTION) ×2 IMPLANT
PACK BASIN DAY SURGERY FS (CUSTOM PROCEDURE TRAY) ×2 IMPLANT
PAD CAST 3X4 CTTN HI CHSV (CAST SUPPLIES) ×1 IMPLANT
PADDING CAST ABS 4INX4YD NS (CAST SUPPLIES) ×1
PADDING CAST ABS COTTON 4X4 ST (CAST SUPPLIES) ×1 IMPLANT
PADDING CAST COTTON 3X4 STRL (CAST SUPPLIES) ×2
SLEEVE SCD COMPRESS KNEE MED (STOCKING) ×1 IMPLANT
SLING ARM FOAM STRAP LRG (SOFTGOODS) ×1 IMPLANT
STOCKINETTE 4X48 STRL (DRAPES) ×2 IMPLANT
SUT ETHILON 4 0 PS 2 18 (SUTURE) ×5 IMPLANT
SUT SILK 4 0 PS 2 (SUTURE) ×2 IMPLANT
SYR BULB EAR ULCER 3OZ GRN STR (SYRINGE) ×2 IMPLANT
SYR CONTROL 10ML LL (SYRINGE) ×1 IMPLANT
TOWEL GREEN STERILE FF (TOWEL DISPOSABLE) ×3 IMPLANT
UNDERPAD 30X36 HEAVY ABSORB (UNDERPADS AND DIAPERS) ×2 IMPLANT

## 2021-04-05 NOTE — Progress Notes (Signed)
Assisted Dr. Houser with right, ultrasound guided, supraclavicular block. Side rails up, monitors on throughout procedure. See vital signs in flow sheet. Tolerated Procedure well. 

## 2021-04-05 NOTE — Anesthesia Preprocedure Evaluation (Addendum)
Anesthesia Evaluation  Patient identified by MRN, date of birth, ID band Patient awake    Reviewed: Allergy & Precautions, NPO status , Patient's Chart, lab work & pertinent test results  Airway Mallampati: II  TM Distance: >3 FB Neck ROM: Full    Dental no notable dental hx. (+) Teeth Intact, Dental Advisory Given   Pulmonary COPD, Current Smoker and Patient abstained from smoking.,    Pulmonary exam normal breath sounds clear to auscultation       Cardiovascular hypertension, Pt. on medications + Peripheral Vascular Disease  Normal cardiovascular exam Rhythm:Regular Rate:Normal     Neuro/Psych negative neurological ROS  negative psych ROS   GI/Hepatic negative GI ROS, Neg liver ROS,   Endo/Other  negative endocrine ROS  Renal/GU negative Renal ROS     Musculoskeletal negative musculoskeletal ROS (+)   Abdominal   Peds  Hematology   Anesthesia Other Findings   Reproductive/Obstetrics                            Anesthesia Physical Anesthesia Plan  ASA: 3  Anesthesia Plan: Regional   Post-op Pain Management: Regional block   Induction:   PONV Risk Score and Plan: 1 and Treatment may vary due to age or medical condition, TIVA, Midazolam and Ondansetron  Airway Management Planned: Natural Airway and Simple Face Mask  Additional Equipment: None  Intra-op Plan:   Post-operative Plan:   Informed Consent: I have reviewed the patients History and Physical, chart, labs and discussed the procedure including the risks, benefits and alternatives for the proposed anesthesia with the patient or authorized representative who has indicated his/her understanding and acceptance.     Dental advisory given  Plan Discussed with: CRNA and Anesthesiologist  Anesthesia Plan Comments:        Anesthesia Quick Evaluation

## 2021-04-05 NOTE — Anesthesia Procedure Notes (Signed)
Anesthesia Regional Block: Interscalene brachial plexus block   Pre-Anesthetic Checklist: , timeout performed,  Correct Patient, Correct Site, Correct Laterality,  Correct Procedure, Correct Position, site marked,  Risks and benefits discussed,  Surgical consent,  Pre-op evaluation,  At surgeon's request and post-op pain management  Laterality: Upper and Right  Prep: Maximum Sterile Barrier Precautions used, chloraprep       Needles:  Injection technique: Single-shot  Needle Type: Echogenic Needle     Needle Length: 5cm  Needle Gauge: 21     Additional Needles:   Procedures:,,,, ultrasound used (permanent image in chart),,    Narrative:  Start time: 04/05/2021 10:44 AM End time: 04/05/2021 10:49 AM Injection made incrementally with aspirations every 5 mL.  Performed by: Personally  Anesthesiologist: Trevor Iha, MD  Additional Notes: Block assessed prior to procedure. Patient tolerated procedure well.

## 2021-04-05 NOTE — H&P (Signed)
Brian Ibarra is an 76 y.o. male.   Chief Complaint: finger contracture HPI: 76 yo male with dupuytrens contracture right long finger and right thumb/index webspace.  It is bothersome to him.  He wishes to have fasciectomy right long finger and right thumb index webspace.  Allergies:  Allergies  Allergen Reactions   Ivp Dye [Iodinated Contrast Media] Rash    Past Medical History:  Diagnosis Date   AAA (abdominal aortic aneurysm)    being monitored by vascular MD, currently 4.5cm   COPD (chronic obstructive pulmonary disease) (HCC)    Descending thoracic aortic aneurysm    4.3cm, monitored yearly by vascular   Heavy smoker    smokes 1-11/2 ppd   Hypertension     Past Surgical History:  Procedure Laterality Date   COLONOSCOPY     HAND SURGERY Bilateral     Family History: Family History  Problem Relation Age of Onset   Heart disease Mother    Cancer Mother    Cancer Father     Social History:   reports that he has been smoking cigarettes. He has been smoking an average of 1.5 packs per day. He has quit using smokeless tobacco. He reports current alcohol use. He reports that he does not use drugs.  Medications: Medications Prior to Admission  Medication Sig Dispense Refill   albuterol (VENTOLIN HFA) 108 (90 Base) MCG/ACT inhaler INHALE TWO PUFFS FOUR TIMES DAILY AS NEEDED.     ALPRAZolam (XANAX) 0.5 MG tablet Take 0.5 mg by mouth 2 (two) times daily as needed for anxiety.     amLODipine-olmesartan (AZOR) 5-20 MG tablet Take 1 tablet by mouth daily.     aspirin 81 MG EC tablet Take by mouth.     atorvastatin (LIPITOR) 20 MG tablet      clopidogrel (PLAVIX) 75 MG tablet Take 75 mg by mouth daily.     polycarbophil (FIBERCON) 625 MG tablet Take 625 mg by mouth daily.     Potassium 99 MG TABS Take by mouth.      No results found for this or any previous visit (from the past 48 hour(s)).  No results found.    Height 6\' 2"  (1.88 m), weight 96.6 kg.  General  appearance: alert, cooperative, and appears stated age Head: Normocephalic, without obvious abnormality, atraumatic Neck: supple, symmetrical, trachea midline Cardio: regular rate and rhythm Resp: clear to auscultation bilaterally Extremities: Intact sensation and capillary refill all digits.  +epl/fpl/io.  No wounds.  Pulses: 2+ and symmetric Skin: Skin color, texture, turgor normal. No rashes or lesions Neurologic: Grossly normal Incision/Wound: none  Assessment/Plan Right long finger and thumb/index webspace dupuytrens contracture.  Plan fasciectomy right long finger and thumb/index webspace.  Non operative and operative treatment options have been discussed with the patient and patient wishes to proceed with operative treatment. Risks, benefits, and alternatives of surgery have been discussed and the patient agrees with the plan of care.   04/05/2021, 10:37 AM

## 2021-04-05 NOTE — Op Note (Signed)
NAME: Brian Ibarra MEDICAL RECORD NO: [id number] DATE OF BIRTH: 1945/12/20 FACILITY: [facility] LOCATION: [facility] PHYSICIAN: Tennis Must, MD   OPERATIVE REPORT   DATE OF PROCEDURE: 04/05/21    PREOPERATIVE DIAGNOSIS: Right long finger and thumb index webspace Dupuytren's contracture   POSTOPERATIVE DIAGNOSIS:  Right long finger and thumb index webspace Dupuytren's contracture   PROCEDURE: 1.  Right palm and long finger fasciectomy 2.  Right thumb index webspace fasciectomy   SURGEON:  Leanora Cover, M.D.   ASSISTANT: Daryll Brod, MD   ANESTHESIA:  Regional with sedation   INTRAVENOUS FLUIDS:  Per anesthesia flow sheet.   ESTIMATED BLOOD LOSS:  Minimal.   COMPLICATIONS:  None.   SPECIMENS: Dupuytren's cord to pathology   TOURNIQUET TIME:    Total Tourniquet Time Documented: Upper Arm (Right) - 96 minutes Total: Upper Arm (Right) - 96 minutes    DISPOSITION:  Stable to PACU.   INDICATIONS: 76 year old male with contracture of right long finger and right thumb index webspace.  He has had previous fasciectomy of ring and small fingers.  He wishes to have a fasciectomy of the right long finger and right thumb index webspace for management of his Dupuytren's contracture.  Risks, benefits and alternatives of surgery were discussed including the risks of blood loss, infection, damage to nerves, vessels, tendons, ligaments, bone for surgery, need for additional surgery, complications with wound healing, continued pain, stiffness, , recurrence.  He voiced understanding of these risks and elected to proceed.  OPERATIVE COURSE:  After being identified preoperatively by myself,  the patient and I agreed on the procedure and site of the procedure.  The surgical site was marked.  Surgical consent had been signed. He was given IV antibiotics as preoperative antibiotic prophylaxis. He was transferred to the operating room and placed on the operating table in supine position  with the Right upper extremity on an arm board.  Sedation was induced by the anesthesiologist. A regional block had been performed by anesthesia in preoperative holding.    Right upper extremity was prepped and draped in normal sterile orthopedic fashion.  A surgical pause was performed between the surgeons, anesthesia, and operating room staff and all were in agreement as to the patient, procedure, and site of procedure.  Tourniquet at the proximal aspect of the extremity was inflated to 250 mmHg after exsanguination of the arm with an Esmarch bandage.  A zigzag incision was made over the cord of the thumb index webspace.  This was carried into subcutaneous tissues by spreading technique.  The cord was easily identified.  The digital nerve and artery to the radial side of the index finger were identified and protected.  The cord was released distally.  It was reflected back on itself and able to be released more proximally near the thumb.  This provided improved palmar abduction of the thumb.  The wound was copiously irrigated with sterile saline.  The corners of the incision were deepened and 4-0 nylon suture used to reapproximate the skin edges.  Attention was then turned to the long finger.  A Bruner type incision was used starting proximally and working distally.  The cord was identified easily proximally.  The radial and ulnar digital neurovascular bundles were identified and protected throughout the case.  The cord was released proximally and reflected back on itself and carefully freed up from surrounding tissues and released from proximal to distal.  Care was taken to protect the neurovascular bundles throughout the  case.  The cord coursed toward the radial side of the long finger.  There was a spiral portion to the cord near the proximal finger flexion crease.  The ulnar-sided neurovascular bundle was not involved in the cord.  The cord was strongly adherent to the skin in the palm and along the radial  side of long finger.  This was carefully freed up.  The radial digital nerve and artery were carefully traced from proximal to distal and protected throughout the case while releasing the cord.  The cord was adherent to the flexor sheath in the palm and over the middle phalanx.  At the middle phalanx of the radial neurovascular bundle went through the cord.  This was carefully traced and the cord released around them.  Manipulation was performed to the DIP joint which released some fibers of cord but there was contracture of the joint itself.  The cord was able to be excised just proximal to the DIP flexion crease.   There was good extension of the MP and PIP joints.  There was some improved extension of the DIP joint with some flexion contracture remaining.  The wound was copiously irrigated with sterile saline.  It was treated with thrombin.  The corners of the incision were then deepened.  4-0 nylon suture was used to reapproximate skin edges.  A vessel loop drain had been placed into the wound.  The wounds were then dressed with sterile Xeroform and 4 x 4's and wrapped with a Kerlix bandage.  The tourniquet was deflated at 96 minutes.  Fingertips were pink with brisk capillary refill after deflation of tourniquet.  A volar splint was placed and wrapped with Kerlix and Ace bandage.  The operative  drapes were broken down.  The patient was awoken from anesthesia safely.  He was transferred back to the stretcher and taken to PACU in stable condition.  I will see him back in the office in 1 week for postoperative followup.  I will give him a prescription for Norco 5/325 1-2 tabs PO q6 hours prn pain, dispense # 25.   Leanora Cover, MD Electronically signed, 04/05/21

## 2021-04-05 NOTE — Transfer of Care (Signed)
Immediate Anesthesia Transfer of Care Note  Patient: Brian Ibarra  Procedure(s) Performed: EXCISION DUPUYTREN'S CONTRACTURES RIGHT HAND, LONG FINGER AND THUMB/INDEX WEBSPACE (Right: Hand)  Patient Location: PACU  Anesthesia Type:MAC and Regional  Level of Consciousness: drowsy and patient cooperative  Airway & Oxygen Therapy: Patient Spontanous Breathing and Patient connected to face mask oxygen  Post-op Assessment: Report given to RN and Post -op Vital signs reviewed and stable  Post vital signs: Reviewed and stable  Last Vitals:  Vitals Value Taken Time  BP    Temp    Pulse 73 04/05/21 1311  Resp 21 04/05/21 1311  SpO2 97 % 04/05/21 1311  Vitals shown include unvalidated device data.  Last Pain:  Vitals:   04/05/21 1038  TempSrc: Oral  PainSc: 0-No pain      Patients Stated Pain Goal: 3 (76/18/48 5927)  Complications: No notable events documented.

## 2021-04-05 NOTE — Op Note (Signed)
I assisted Surgeon(s) and Role:    * Leanora Cover, MD - Primary    Daryll Brod, MD - Assisting on the Procedure(s): EXCISION DUPUYTREN'S CONTRACTURES RIGHT HAND, LONG FINGER AND THUMB/INDEX WEBSPACE on 04/05/2021.  I provided assistance on this case as follows: Set up, approach, removal of cord webspace with webspace deepening and V-Y advancement, approach identification neurovascular bundles middle finger with identification of the cord tracing neurovascular bundles distally with removal of Dupuytren's cord closure of the wound and application dressing and splint.  Electronically signed by: Daryll Brod, MD Date: 04/05/2021 Time: 1:15 PM

## 2021-04-05 NOTE — Anesthesia Postprocedure Evaluation (Signed)
Anesthesia Post Note  Patient: ARTYOM STENCEL  Procedure(s) Performed: EXCISION DUPUYTREN'S CONTRACTURES RIGHT HAND, LONG FINGER AND THUMB/INDEX WEBSPACE (Right: Hand)     Patient location during evaluation: PACU Anesthesia Type: Regional Level of consciousness: awake and alert Pain management: pain level controlled Vital Signs Assessment: post-procedure vital signs reviewed and stable Respiratory status: spontaneous breathing, nonlabored ventilation, respiratory function stable and patient connected to nasal cannula oxygen Cardiovascular status: stable and blood pressure returned to baseline Postop Assessment: no apparent nausea or vomiting Anesthetic complications: no   No notable events documented.  Last Vitals:  Vitals:   04/05/21 1330 04/05/21 1401  BP: (!) 162/70 (!) 148/82  Pulse: 65 (!) 59  Resp: 20 19  Temp:  (!) 36.1 C  SpO2: 98% 93%    Last Pain:  Vitals:   04/05/21 1401  TempSrc:   PainSc: 0-No pain                 Trevor Iha

## 2021-04-05 NOTE — Discharge Instructions (Addendum)

## 2021-04-06 ENCOUNTER — Encounter (HOSPITAL_BASED_OUTPATIENT_CLINIC_OR_DEPARTMENT_OTHER): Payer: Self-pay | Admitting: Orthopedic Surgery

## 2021-04-06 LAB — SURGICAL PATHOLOGY

## 2021-04-13 DIAGNOSIS — R52 Pain, unspecified: Secondary | ICD-10-CM | POA: Diagnosis not present

## 2021-04-13 DIAGNOSIS — M25641 Stiffness of right hand, not elsewhere classified: Secondary | ICD-10-CM | POA: Diagnosis not present

## 2021-04-13 DIAGNOSIS — M720 Palmar fascial fibromatosis [Dupuytren]: Secondary | ICD-10-CM | POA: Diagnosis not present

## 2021-04-20 DIAGNOSIS — R52 Pain, unspecified: Secondary | ICD-10-CM | POA: Diagnosis not present

## 2021-04-20 DIAGNOSIS — M25641 Stiffness of right hand, not elsewhere classified: Secondary | ICD-10-CM | POA: Diagnosis not present

## 2021-04-20 DIAGNOSIS — M720 Palmar fascial fibromatosis [Dupuytren]: Secondary | ICD-10-CM | POA: Diagnosis not present

## 2021-04-27 DIAGNOSIS — M720 Palmar fascial fibromatosis [Dupuytren]: Secondary | ICD-10-CM | POA: Diagnosis not present

## 2021-04-27 DIAGNOSIS — R52 Pain, unspecified: Secondary | ICD-10-CM | POA: Diagnosis not present

## 2021-04-27 DIAGNOSIS — M25641 Stiffness of right hand, not elsewhere classified: Secondary | ICD-10-CM | POA: Diagnosis not present

## 2021-05-04 DIAGNOSIS — R52 Pain, unspecified: Secondary | ICD-10-CM | POA: Diagnosis not present

## 2021-05-04 DIAGNOSIS — M720 Palmar fascial fibromatosis [Dupuytren]: Secondary | ICD-10-CM | POA: Diagnosis not present

## 2021-05-04 DIAGNOSIS — M25641 Stiffness of right hand, not elsewhere classified: Secondary | ICD-10-CM | POA: Diagnosis not present

## 2021-05-16 DIAGNOSIS — R52 Pain, unspecified: Secondary | ICD-10-CM | POA: Diagnosis not present

## 2021-05-16 DIAGNOSIS — M25641 Stiffness of right hand, not elsewhere classified: Secondary | ICD-10-CM | POA: Diagnosis not present

## 2021-05-16 DIAGNOSIS — M720 Palmar fascial fibromatosis [Dupuytren]: Secondary | ICD-10-CM | POA: Diagnosis not present

## 2021-06-01 DIAGNOSIS — M720 Palmar fascial fibromatosis [Dupuytren]: Secondary | ICD-10-CM | POA: Diagnosis not present

## 2021-06-01 DIAGNOSIS — M25641 Stiffness of right hand, not elsewhere classified: Secondary | ICD-10-CM | POA: Diagnosis not present

## 2021-06-01 DIAGNOSIS — R52 Pain, unspecified: Secondary | ICD-10-CM | POA: Diagnosis not present

## 2021-06-05 DIAGNOSIS — M25641 Stiffness of right hand, not elsewhere classified: Secondary | ICD-10-CM | POA: Diagnosis not present

## 2021-06-05 DIAGNOSIS — M720 Palmar fascial fibromatosis [Dupuytren]: Secondary | ICD-10-CM | POA: Diagnosis not present

## 2021-06-05 DIAGNOSIS — R600 Localized edema: Secondary | ICD-10-CM | POA: Diagnosis not present

## 2021-06-05 DIAGNOSIS — L905 Scar conditions and fibrosis of skin: Secondary | ICD-10-CM | POA: Diagnosis not present

## 2021-06-05 DIAGNOSIS — R52 Pain, unspecified: Secondary | ICD-10-CM | POA: Diagnosis not present

## 2021-06-22 DIAGNOSIS — M720 Palmar fascial fibromatosis [Dupuytren]: Secondary | ICD-10-CM | POA: Diagnosis not present

## 2021-06-22 DIAGNOSIS — M25641 Stiffness of right hand, not elsewhere classified: Secondary | ICD-10-CM | POA: Diagnosis not present

## 2021-06-22 DIAGNOSIS — L905 Scar conditions and fibrosis of skin: Secondary | ICD-10-CM | POA: Diagnosis not present

## 2021-06-22 DIAGNOSIS — R52 Pain, unspecified: Secondary | ICD-10-CM | POA: Diagnosis not present

## 2021-06-27 DIAGNOSIS — R600 Localized edema: Secondary | ICD-10-CM | POA: Diagnosis not present

## 2021-06-27 DIAGNOSIS — L905 Scar conditions and fibrosis of skin: Secondary | ICD-10-CM | POA: Diagnosis not present

## 2021-06-27 DIAGNOSIS — M720 Palmar fascial fibromatosis [Dupuytren]: Secondary | ICD-10-CM | POA: Diagnosis not present

## 2021-06-27 DIAGNOSIS — M25641 Stiffness of right hand, not elsewhere classified: Secondary | ICD-10-CM | POA: Diagnosis not present

## 2021-06-27 DIAGNOSIS — R52 Pain, unspecified: Secondary | ICD-10-CM | POA: Diagnosis not present

## 2021-07-06 DIAGNOSIS — R600 Localized edema: Secondary | ICD-10-CM | POA: Diagnosis not present

## 2021-07-06 DIAGNOSIS — R52 Pain, unspecified: Secondary | ICD-10-CM | POA: Diagnosis not present

## 2021-07-06 DIAGNOSIS — L905 Scar conditions and fibrosis of skin: Secondary | ICD-10-CM | POA: Diagnosis not present

## 2021-07-06 DIAGNOSIS — M720 Palmar fascial fibromatosis [Dupuytren]: Secondary | ICD-10-CM | POA: Diagnosis not present

## 2021-07-06 DIAGNOSIS — M25641 Stiffness of right hand, not elsewhere classified: Secondary | ICD-10-CM | POA: Diagnosis not present

## 2021-08-22 DIAGNOSIS — M720 Palmar fascial fibromatosis [Dupuytren]: Secondary | ICD-10-CM | POA: Diagnosis not present

## 2021-09-24 DIAGNOSIS — Z6828 Body mass index (BMI) 28.0-28.9, adult: Secondary | ICD-10-CM | POA: Diagnosis not present

## 2021-09-24 DIAGNOSIS — R21 Rash and other nonspecific skin eruption: Secondary | ICD-10-CM | POA: Diagnosis not present

## 2021-09-24 DIAGNOSIS — I10 Essential (primary) hypertension: Secondary | ICD-10-CM | POA: Diagnosis not present

## 2021-09-24 DIAGNOSIS — W57XXXA Bitten or stung by nonvenomous insect and other nonvenomous arthropods, initial encounter: Secondary | ICD-10-CM | POA: Diagnosis not present

## 2021-10-29 ENCOUNTER — Other Ambulatory Visit: Payer: Self-pay | Admitting: Interventional Radiology

## 2021-10-29 DIAGNOSIS — M79662 Pain in left lower leg: Secondary | ICD-10-CM

## 2021-11-06 ENCOUNTER — Ambulatory Visit
Admission: RE | Admit: 2021-11-06 | Discharge: 2021-11-06 | Disposition: A | Discharge status: Home / Self Care | Payer: Medicare PPO | Source: Ambulatory Visit | Attending: Interventional Radiology | Admitting: Interventional Radiology

## 2021-11-06 DIAGNOSIS — R252 Cramp and spasm: Secondary | ICD-10-CM | POA: Diagnosis not present

## 2021-11-06 DIAGNOSIS — M79662 Pain in left lower leg: Secondary | ICD-10-CM

## 2021-11-06 HISTORY — PX: IR RADIOLOGIST EVAL & MGMT: IMG5224

## 2021-11-06 NOTE — Progress Notes (Signed)
Chief Complaint: Patient was consulted remotely today (TeleHealth) for bilateral hand and calf night cramps at the request of Yash Cacciola K.    Referring Physician(s): Charlen Bakula K  History of Present Illness: Brian Ibarra is a 76 y.o. male with a history of Rutherford category 3 lower extremity claudication in the bilateral lower extremities. He failed a 3 month conservative trial of scheduled exercise and cilostazol and underwent revascularization of his chronic moderate segment right superficial femoral artery occlusion on August 25, 2015 and his moderate segment left superficial femoral artery occlusion on September 01, 2015. Directional atherectomy and drug-eluting balloon angioplasty were employed for both procedures.   We spoke over the telephone today at Mr. Brian Ibarra request.  He has been experiencing intermittent episodes of calf tightening and cramping that wakes him up from sleep at night.  The symptoms are improved when he gets up and starts walking around.  He denies issues with calf pain or cramping or fatigue during ambulation.  He is able to walk as far as he would like.  The limiting factor for him is his respiratory capacity.  Similarly, he has occasional episodes where he wakes from sleep with a frozen sensation in his hands where his hands are unable to move initially, however as he continues to work the digits the hand seem to wake up and then function normally.  This does not happen with activity or exertion.  Otherwise, he continues to do well.  He is about to go on vacation to [facility].  His aneurysmal disease is being actively followed by vascular surgery.  Past Medical History:  Diagnosis Date   AAA (abdominal aortic aneurysm)    being monitored by vascular MD, currently 4.5cm   COPD (chronic obstructive pulmonary disease) (HCC)    Descending thoracic aortic aneurysm    4.3cm, monitored yearly by vascular   Heavy smoker    smokes 1-11/2 ppd    Hypertension     Past Surgical History:  Procedure Laterality Date   COLONOSCOPY     DUPUYTREN CONTRACTURE RELEASE Right 04/05/2021   Procedure: EXCISION DUPUYTREN'S CONTRACTURES RIGHT HAND, LONG FINGER AND THUMB/INDEX WEBSPACE;  Surgeon: Betha Loa, MD;  Location: [facility];  Service: Orthopedics;  Laterality: Right;   HAND SURGERY Bilateral     Allergies: Ivp dye [iodinated contrast media]  Medications: Prior to Admission medications   Medication Sig Start Date End Date Taking? Authorizing Provider  albuterol (VENTOLIN HFA) 108 (90 Base) MCG/ACT inhaler INHALE TWO PUFFS FOUR TIMES DAILY AS NEEDED. 09/15/19   [provider]  ALPRAZolam Prudy Feeler) 0.5 MG tablet Take 0.5 mg by mouth 2 (two) times daily as needed for anxiety.    [provider]  amLODipine-olmesartan (AZOR) 5-20 MG tablet Take 1 tablet by mouth daily. 12/15/19   [provider]  aspirin 81 MG EC tablet Take by mouth.    [provider]  atorvastatin (LIPITOR) 20 MG tablet  10/05/18   [provider]  clopidogrel (PLAVIX) 75 MG tablet Take 75 mg by mouth daily. 12/15/19   [provider]  HYDROcodone-acetaminophen (NORCO) 5-325 MG tablet 1-2 tabs po q6 hours prn pain 04/05/21   Betha Loa, MD  polycarbophil (FIBERCON) 625 MG tablet Take 625 mg by mouth daily.    [provider]  Potassium 99 MG TABS Take by mouth.    [provider]     Family History  Problem Relation Age of Onset   Heart disease Mother  Cancer Mother    Cancer Father     Social History   Socioeconomic History   Marital status: Divorced    Spouse name: Not on file   Number of children: Not on file   Years of education: Not on file   Highest education level: Not on file  Occupational History   Not on file  Tobacco Use   Smoking status: Every Day    Packs/day: 1.50    Types: Cigarettes   Smokeless tobacco: Former  Substance and Sexual Activity    Alcohol use: Yes    Comment: drinks 1 burbon nightly   Drug use: Never   Sexual activity: Yes  Other Topics Concern   Not on file  Social History Narrative   Not on file   Social Determinants of Health   Financial Resource Strain: Not on file  Food Insecurity: Not on file  Transportation Needs: Not on file  Physical Activity: Not on file  Stress: Not on file  Social Connections: Not on file    Review of Systems  Review of Systems: A 12 point ROS discussed and pertinent positives are indicated in the HPI above.  All other systems are negative.   Physical Exam No direct physical exam was performed (except for noted visual exam findings with Video Visits).    Vital Signs: There were no vitals taken for this visit.  Imaging: No results found.  Labs:  CBC: No results for input(s): "WBC", "HGB", "HCT", "PLT" in the last 8760 hours.  COAGS: No results for input(s): "INR", "APTT" in the last 8760 hours.  BMP: Recent Labs    12/27/20 0918  CREATININE 0.80    LIVER FUNCTION TESTS: No results for input(s): "BILITOT", "AST", "ALT", "ALKPHOS", "PROT", "ALBUMIN" in the last 8760 hours.  TUMOR MARKERS: No results for input(s): "AFPTM", "CEA", "CA199", "CHROMGRNA" in the last 8760 hours.  Assessment and Plan:  76 year old gentleman with a long history of smoking and peripheral arterial disease as well as aortic aneurysmal disease.  His current symptoms of hand and calf cramping that occurs at night and resolves with movement are not consistent with arterial insufficiency.  He remains relatively asymptomatic from a claudication perspective indicating a durable result from his prior revascularization procedures.  I encouraged him to continue follow-up with his primary care physician to evaluate for any underlying electrolyte disturbances, dehydration, muscular disorder or neuropathy.  He continues to follow-up with vein and vascular specialists for his aneurysmal  disease.    Electronically Signed: Sterling Big 11/06/2021, 12:47 PM   I spent a total of  10 Minutes in remote  clinical consultation, greater than 50% of which was counseling/coordinating care for hand and calf cramping.    Visit type: Audio only (telephone). Audio (no video) only due to patient preference. Alternative for in-person consultation at [facility], [address] [place], [place], [place]. This visit type was conducted due to national recommendations for restrictions regarding the COVID-19 Pandemic (e.g. social distancing).  This format is felt to be most appropriate for this patient at this time.  All issues noted in this document were discussed and addressed.

## 2021-11-23 DIAGNOSIS — R252 Cramp and spasm: Secondary | ICD-10-CM | POA: Diagnosis not present

## 2021-11-23 DIAGNOSIS — Z9181 History of falling: Secondary | ICD-10-CM | POA: Diagnosis not present

## 2021-11-23 DIAGNOSIS — J439 Emphysema, unspecified: Secondary | ICD-10-CM | POA: Diagnosis not present

## 2021-11-23 DIAGNOSIS — E782 Mixed hyperlipidemia: Secondary | ICD-10-CM | POA: Diagnosis not present

## 2021-11-23 DIAGNOSIS — I739 Peripheral vascular disease, unspecified: Secondary | ICD-10-CM | POA: Diagnosis not present

## 2021-11-23 DIAGNOSIS — Z125 Encounter for screening for malignant neoplasm of prostate: Secondary | ICD-10-CM | POA: Diagnosis not present

## 2021-11-23 DIAGNOSIS — I10 Essential (primary) hypertension: Secondary | ICD-10-CM | POA: Diagnosis not present

## 2021-11-23 DIAGNOSIS — Z79899 Other long term (current) drug therapy: Secondary | ICD-10-CM | POA: Diagnosis not present

## 2021-11-23 DIAGNOSIS — Z0000 Encounter for general adult medical examination without abnormal findings: Secondary | ICD-10-CM | POA: Diagnosis not present

## 2021-11-28 ENCOUNTER — Other Ambulatory Visit: Payer: Self-pay

## 2021-11-28 DIAGNOSIS — I7123 Aneurysm of the descending thoracic aorta, without rupture: Secondary | ICD-10-CM

## 2021-11-28 MED ORDER — PREDNISONE 50 MG PO TABS: 50.0000 mg | ORAL_TABLET | Freq: Three times a day (TID) | ORAL | 0 refills | Status: DC

## 2022-01-07 ENCOUNTER — Telehealth: Payer: Self-pay

## 2022-01-07 NOTE — Telephone Encounter (Signed)
Called patient regarding his upcoming CT scan with contrast he has with [place] at [facility] and his allergy to contrast. Pt reports he had a Phlebectomy procedure done several years ago and developed a slight rash afterwards. However, pt reports he has since had CT scans with contrast (states annually), with no prep, and has not had any issues. Pt states he just had one last year, did not take prednisone or benadryl prior and did not develop a rash, swelling, difficulty breathing, etc... Therefore, this patient will not be be given a 13 hr prep per Dr. Jeralyn Ruths.

## 2022-01-08 ENCOUNTER — Other Ambulatory Visit: Payer: Self-pay | Admitting: *Deleted

## 2022-01-08 DIAGNOSIS — I7123 Aneurysm of the descending thoracic aorta, without rupture: Secondary | ICD-10-CM

## 2022-01-14 ENCOUNTER — Ambulatory Visit
Admission: RE | Admit: 2022-01-14 | Discharge: 2022-01-14 | Disposition: A | Discharge status: Home / Self Care | Payer: Medicare PPO | Source: Ambulatory Visit | Attending: Vascular Surgery | Admitting: Vascular Surgery

## 2022-01-14 DIAGNOSIS — I712 Thoracic aortic aneurysm, without rupture, unspecified: Secondary | ICD-10-CM | POA: Diagnosis not present

## 2022-01-14 DIAGNOSIS — I700 Atherosclerosis of aorta: Secondary | ICD-10-CM | POA: Diagnosis not present

## 2022-01-14 DIAGNOSIS — J439 Emphysema, unspecified: Secondary | ICD-10-CM | POA: Diagnosis not present

## 2022-01-14 DIAGNOSIS — J8410 Pulmonary fibrosis, unspecified: Secondary | ICD-10-CM | POA: Diagnosis not present

## 2022-01-14 DIAGNOSIS — I7123 Aneurysm of the descending thoracic aorta, without rupture: Secondary | ICD-10-CM

## 2022-01-14 MED ORDER — IOPAMIDOL (ISOVUE-370) INJECTION 76%
75.0000 mL | Freq: Once | INTRAVENOUS | Status: AC | PRN
  Administered 2022-01-14: 75 mL via INTRAVENOUS

## 2022-01-22 ENCOUNTER — Ambulatory Visit: Payer: Medicare PPO | Admitting: Vascular Surgery

## 2022-01-22 ENCOUNTER — Encounter: Payer: Self-pay | Admitting: Vascular Surgery

## 2022-01-22 ENCOUNTER — Ambulatory Visit (HOSPITAL_COMMUNITY)
Admission: RE | Admit: 2022-01-22 | Discharge: 2022-01-22 | Disposition: A | Discharge status: Home / Self Care | Payer: Medicare PPO | Source: Ambulatory Visit | Attending: Vascular Surgery | Admitting: Vascular Surgery

## 2022-01-22 VITALS — BP 154/81 | HR 88 | Temp 97.7°F | Resp 18 | Ht 74.0 in | Wt 219.0 lb

## 2022-01-22 DIAGNOSIS — I7143 Infrarenal abdominal aortic aneurysm, without rupture: Secondary | ICD-10-CM | POA: Diagnosis not present

## 2022-01-22 DIAGNOSIS — I7123 Aneurysm of the descending thoracic aorta, without rupture: Secondary | ICD-10-CM

## 2022-01-22 NOTE — Progress Notes (Signed)
Patient name: Brian Ibarra MRN: [id number] DOB: 1945/07/11 Sex: male  REASON FOR CONSULT: 1 year follow-up for 4.5 cm descending thoracic aneurysm and 4.6 cm abdominal aortic aneurysm  HPI: Brian Ibarra is a 76 y.o. male, with history of COPD, hypertension, tobacco abuse that presents for 1 year follow-up of his 4.5 cm descending thoracic aneurysm and 4.6 cm abdominal aortic aneurysm.  He has no new complaints today.  He is still smoking about a pack and half a day. 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  No chest pain.  No abdominal or back pain.  Past Medical History:  Diagnosis Date   AAA (abdominal aortic aneurysm) ([place])    being monitored by vascular MD, currently 4.5cm   COPD (chronic obstructive pulmonary disease) (HCC)    Descending thoracic aortic aneurysm (HCC)    4.3cm, monitored yearly by vascular   Heavy smoker    smokes 1-11/2 ppd   Hypertension    PSH: None  Family History  Problem Relation Age of Onset   Heart disease Mother    Cancer Mother    Cancer Father     SOCIAL HISTORY: Social History   Socioeconomic History   Marital status: Divorced    Spouse name: Not on file   Number of children: Not on file   Years of education: Not on file   Highest education level: Not on file  Occupational History   Not on file  Tobacco Use   Smoking status: Every Day    Packs/day: 1.50    Types: Cigarettes   Smokeless tobacco: Former  Substance and Sexual Activity   Alcohol use: Yes    Comment: drinks 1 burbon nightly   Drug use: Never   Sexual activity: Yes  Other Topics Concern   Not on file  Social History Narrative   Not on file   Social Determinants of Health   Financial Resource Strain: Not on file  Food Insecurity: Not on file  Transportation Needs: Not on file  Physical Activity: Not on file  Stress: Not on file  Social Connections: Not on file  Intimate Partner Violence: Not on file    No Active Allergies  Current Outpatient  Medications  Medication Sig Dispense Refill   albuterol (VENTOLIN HFA) 108 (90 Base) MCG/ACT inhaler INHALE TWO PUFFS FOUR TIMES DAILY AS NEEDED.     ALPRAZolam (XANAX) 0.5 MG tablet Take 0.5 mg by mouth 2 (two) times daily as needed for anxiety.     amLODipine-olmesartan (AZOR) 5-20 MG tablet Take 1 tablet by mouth daily.     aspirin 81 MG EC tablet Take by mouth.     atorvastatin (LIPITOR) 20 MG tablet      clopidogrel (PLAVIX) 75 MG tablet Take 75 mg by mouth daily.     HYDROcodone-acetaminophen (NORCO) 5-325 MG tablet 1-2 tabs po q6 hours prn pain 25 tablet 0   polycarbophil (FIBERCON) 625 MG tablet Take 625 mg by mouth daily.     Potassium 99 MG TABS Take by mouth.     predniSONE (DELTASONE) 50 MG tablet Take 1 tablet (50 mg total) by mouth 3 (three) times daily. Take 1 tab po 13 hrs prior to procedure Take 1 tab po 7 hours prior to procedure Take 1 tab by mouth 1 hour prior to procedure (Patient not taking: Reported on 01/22/2022) 3 tablet 0   No current facility-administered medications for this visit.    REVIEW OF SYSTEMS:  [X]  denotes positive finding, [ ]   denotes negative finding Cardiac  Comments:  Chest pain or chest pressure:    Shortness of breath upon exertion:    Short of breath when lying flat:    Irregular heart rhythm:        Vascular    Pain in calf, thigh, or hip brought on by ambulation:    Pain in feet at night that wakes you up from your sleep:     Blood clot in your veins:    Leg swelling:         Pulmonary    Oxygen at home:    Productive cough:     Wheezing:         Neurologic    Sudden weakness in arms or legs:     Sudden numbness in arms or legs:     Sudden onset of difficulty speaking or slurred speech:    Temporary loss of vision in one eye:     Problems with dizziness:         Gastrointestinal    Blood in stool:     Vomited blood:         Genitourinary    Burning when urinating:     Blood in urine:        Psychiatric    Major  depression:         Hematologic    Bleeding problems:    Problems with blood clotting too easily:        Skin    Rashes or ulcers:        Constitutional    Fever or chills:      PHYSICAL EXAM: Vitals:   01/22/22 1000  BP: (!) 154/81  Pulse: 88  Resp: 18  Temp: 97.7 F (36.5 C)  TempSrc: Temporal  SpO2: 94%  Weight: 219 lb (99.3 kg)  Height: 6\' 2"  (1.88 m)    GENERAL: The patient is a well-nourished male, in no acute distress. The vital signs are documented above. CARDIAC: There is a regular rate and rhythm.  VASCULAR:  Palpable femoral pulses bilateral groins PULMONARY: No respiratory distress. ABDOMEN: Soft and non-tender.  No pain with deep palpation. MUSCULOSKELETAL: There are no major deformities or cyanosis. NEUROLOGIC: No focal weakness or paresthesias are detected. SKIN: There are no ulcers or rashes noted. PSYCHIATRIC: The patient has a normal affect.  DATA:   CTA chest 01/14/2022 on my review shows stable descending thoracic aneurysm measuring 4.5 cm.  AAA duplex today shows slight increase in AAA from 4.6 cm to 4.75 cm  Assessment/Plan:  76 year old male presents for 1 year follow-up of a descending thoracic aneurysm and an abdominal aortic aneurysm.  I discussed with him that his thoracic aneurysm looks relatively stable at 4.5 cm.  No indication for repair unless greater than usually 5.5 - 6 cm.  His abdominal aortic aneurysm has also shown minimal growth now measuring 4.75 cm by duplex.  Discussed indication for repair is greater than 5.5 cm in men unless rapid growth.  Given his AAA now measures 4.75 cm, I will see him again in 6 months with a AAA duplex.  He will be due for another CT chest in 1 year for surveillance of his thoracic aneurysm.   Marty Heck, MD [facility] Office: [phone]

## 2022-01-24 ENCOUNTER — Other Ambulatory Visit: Payer: Self-pay

## 2022-01-24 DIAGNOSIS — I7121 Aneurysm of the ascending aorta, without rupture: Secondary | ICD-10-CM

## 2022-03-25 HISTORY — PX: TOTAL HIP ARTHROPLASTY: SHX124

## 2022-05-20 DIAGNOSIS — J069 Acute upper respiratory infection, unspecified: Secondary | ICD-10-CM | POA: Diagnosis not present

## 2022-05-20 DIAGNOSIS — B351 Tinea unguium: Secondary | ICD-10-CM | POA: Diagnosis not present

## 2022-05-20 DIAGNOSIS — Z6829 Body mass index (BMI) 29.0-29.9, adult: Secondary | ICD-10-CM | POA: Diagnosis not present

## 2022-05-20 DIAGNOSIS — I10 Essential (primary) hypertension: Secondary | ICD-10-CM | POA: Diagnosis not present

## 2022-05-20 DIAGNOSIS — F410 Panic disorder [episodic paroxysmal anxiety] without agoraphobia: Secondary | ICD-10-CM | POA: Diagnosis not present

## 2022-06-05 DIAGNOSIS — L602 Onychogryphosis: Secondary | ICD-10-CM | POA: Diagnosis not present

## 2022-06-05 DIAGNOSIS — L600 Ingrowing nail: Secondary | ICD-10-CM | POA: Diagnosis not present

## 2022-08-05 ENCOUNTER — Other Ambulatory Visit: Payer: Self-pay | Admitting: *Deleted

## 2022-08-05 DIAGNOSIS — I7121 Aneurysm of the ascending aorta, without rupture: Secondary | ICD-10-CM

## 2022-08-12 NOTE — Progress Notes (Unsigned)
Patient name: Brian Ibarra MRN: [id number] DOB: September 26, 1945 Sex: male  REASON FOR CONSULT: 6 month follow-up 4.75 cm AAA  HPI: Brian Ibarra is a 77 y.o. male, with history of COPD, hypertension, tobacco abuse that presents 6 month follow-up of 4.75 cm AAA.   He has no new complaints today.  He is still smoking about a pack and half a day. 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  No chest pain.  No abdominal or back pain.  We are also following a 4.5 cm descending thoracic aneurysm last evaluated on 01/22/2022.  Past Medical History:  Diagnosis Date   AAA (abdominal aortic aneurysm) (HCC)    being monitored by vascular MD, currently 4.5cm   COPD (chronic obstructive pulmonary disease) (HCC)    Descending thoracic aortic aneurysm (HCC)    4.3cm, monitored yearly by vascular   Heavy smoker    smokes 1-11/2 ppd   Hypertension    PSH: None  Family History  Problem Relation Age of Onset   Heart disease Mother    Cancer Mother    Cancer Father     SOCIAL HISTORY: Social History   Socioeconomic History   Marital status: Divorced    Spouse name: Not on file   Number of children: Not on file   Years of education: Not on file   Highest education level: Not on file  Occupational History   Not on file  Tobacco Use   Smoking status: Every Day    Packs/day: 1.5    Types: Cigarettes   Smokeless tobacco: Former  Substance and Sexual Activity   Alcohol use: Yes    Comment: drinks 1 burbon nightly   Drug use: Never   Sexual activity: Yes  Other Topics Concern   Not on file  Social History Narrative   Not on file   Social Determinants of Health   Financial Resource Strain: Not on file  Food Insecurity: Not on file  Transportation Needs: Not on file  Physical Activity: Not on file  Stress: Not on file  Social Connections: Not on file  Intimate Partner Violence: Not on file    No Active Allergies  Current Outpatient Medications  Medication Sig Dispense  Refill   albuterol (VENTOLIN HFA) 108 (90 Base) MCG/ACT inhaler INHALE TWO PUFFS FOUR TIMES DAILY AS NEEDED.     ALPRAZolam (XANAX) 0.5 MG tablet Take 0.5 mg by mouth 2 (two) times daily as needed for anxiety.     amLODipine-olmesartan (AZOR) 5-20 MG tablet Take 1 tablet by mouth daily.     aspirin 81 MG EC tablet Take by mouth.     atorvastatin (LIPITOR) 20 MG tablet      clopidogrel (PLAVIX) 75 MG tablet Take 75 mg by mouth daily.     HYDROcodone-acetaminophen (NORCO) 5-325 MG tablet 1-2 tabs po q6 hours prn pain 25 tablet 0   polycarbophil (FIBERCON) 625 MG tablet Take 625 mg by mouth daily.     Potassium 99 MG TABS Take by mouth.     predniSONE (DELTASONE) 50 MG tablet Take 1 tablet (50 mg total) by mouth 3 (three) times daily. Take 1 tab po 13 hrs prior to procedure Take 1 tab po 7 hours prior to procedure Take 1 tab by mouth 1 hour prior to procedure (Patient not taking: Reported on 01/22/2022) 3 tablet 0   No current facility-administered medications for this visit.    REVIEW OF SYSTEMS:  [X]  denotes positive finding, [ ]  denotes  negative finding Cardiac  Comments:  Chest pain or chest pressure:    Shortness of breath upon exertion:    Short of breath when lying flat:    Irregular heart rhythm:        Vascular    Pain in calf, thigh, or hip brought on by ambulation:    Pain in feet at night that wakes you up from your sleep:     Blood clot in your veins:    Leg swelling:         Pulmonary    Oxygen at home:    Productive cough:     Wheezing:         Neurologic    Sudden weakness in arms or legs:     Sudden numbness in arms or legs:     Sudden onset of difficulty speaking or slurred speech:    Temporary loss of vision in one eye:     Problems with dizziness:         Gastrointestinal    Blood in stool:     Vomited blood:         Genitourinary    Burning when urinating:     Blood in urine:        Psychiatric    Major depression:         Hematologic    Bleeding  problems:    Problems with blood clotting too easily:        Skin    Rashes or ulcers:        Constitutional    Fever or chills:      PHYSICAL EXAM: There were no vitals filed for this visit.   GENERAL: The patient is a well-nourished male, in no acute distress. The vital signs are documented above. CARDIAC: There is a regular rate and rhythm.  VASCULAR:  Palpable femoral pulses bilateral groins PULMONARY: No respiratory distress. ABDOMEN: Soft and non-tender.  No pain with deep palpation. MUSCULOSKELETAL: There are no major deformities or cyanosis. NEUROLOGIC: No focal weakness or paresthesias are detected. SKIN: There are no ulcers or rashes noted. PSYCHIATRIC: The patient has a normal affect.  DATA:   CTA chest 01/14/2022 on my review shows stable descending thoracic aneurysm measuring 4.5 cm.  AAA duplex today shows 4.75 cm --> over past 6 months  Assessment/Plan:  77 year old male presents for 6 month follow-up of a 4.75 cm AAA.    Will plan to see him again in 6 months with AAA duplex.  At that time we will reevaluate his descending thoracic aneurysm as well.  This last measured 4.5 cm.   No indication for repair unless greater than usually 5.5 - 6 cm.    Brian Shelling, MD [facility] Office: [phone]

## 2022-08-13 ENCOUNTER — Encounter: Payer: Self-pay | Admitting: Vascular Surgery

## 2022-08-13 ENCOUNTER — Ambulatory Visit (HOSPITAL_COMMUNITY)
Admission: RE | Admit: 2022-08-13 | Discharge: 2022-08-13 | Disposition: A | Discharge status: Home / Self Care | Payer: Medicare PPO | Source: Ambulatory Visit | Attending: Vascular Surgery | Admitting: Vascular Surgery

## 2022-08-13 ENCOUNTER — Ambulatory Visit: Payer: Medicare PPO | Admitting: Vascular Surgery

## 2022-08-13 VITALS — BP 162/80 | HR 80 | Temp 98.5°F | Resp 20 | Ht 74.0 in | Wt 224.0 lb

## 2022-08-13 DIAGNOSIS — I7121 Aneurysm of the ascending aorta, without rupture: Secondary | ICD-10-CM | POA: Diagnosis not present

## 2022-08-13 DIAGNOSIS — I7123 Aneurysm of the descending thoracic aorta, without rupture: Secondary | ICD-10-CM | POA: Diagnosis not present

## 2022-08-13 DIAGNOSIS — I7143 Infrarenal abdominal aortic aneurysm, without rupture: Secondary | ICD-10-CM | POA: Diagnosis not present

## 2022-08-22 ENCOUNTER — Other Ambulatory Visit: Payer: Self-pay

## 2022-08-22 DIAGNOSIS — I7143 Infrarenal abdominal aortic aneurysm, without rupture: Secondary | ICD-10-CM

## 2022-11-26 DIAGNOSIS — Z1339 Encounter for screening examination for other mental health and behavioral disorders: Secondary | ICD-10-CM | POA: Diagnosis not present

## 2022-11-26 DIAGNOSIS — I7143 Infrarenal abdominal aortic aneurysm, without rupture: Secondary | ICD-10-CM | POA: Diagnosis not present

## 2022-11-26 DIAGNOSIS — Z79899 Other long term (current) drug therapy: Secondary | ICD-10-CM | POA: Diagnosis not present

## 2022-11-26 DIAGNOSIS — I10 Essential (primary) hypertension: Secondary | ICD-10-CM | POA: Diagnosis not present

## 2022-11-26 DIAGNOSIS — H6123 Impacted cerumen, bilateral: Secondary | ICD-10-CM | POA: Diagnosis not present

## 2022-11-26 DIAGNOSIS — I739 Peripheral vascular disease, unspecified: Secondary | ICD-10-CM | POA: Diagnosis not present

## 2022-11-26 DIAGNOSIS — E782 Mixed hyperlipidemia: Secondary | ICD-10-CM | POA: Diagnosis not present

## 2022-11-26 DIAGNOSIS — Z0000 Encounter for general adult medical examination without abnormal findings: Secondary | ICD-10-CM | POA: Diagnosis not present

## 2022-11-26 DIAGNOSIS — R002 Palpitations: Secondary | ICD-10-CM | POA: Diagnosis not present

## 2022-11-26 DIAGNOSIS — J439 Emphysema, unspecified: Secondary | ICD-10-CM | POA: Diagnosis not present

## 2022-11-27 DIAGNOSIS — H6123 Impacted cerumen, bilateral: Secondary | ICD-10-CM | POA: Diagnosis not present

## 2022-12-13 DIAGNOSIS — R002 Palpitations: Secondary | ICD-10-CM | POA: Diagnosis not present

## 2022-12-20 ENCOUNTER — Other Ambulatory Visit: Payer: Self-pay

## 2022-12-20 DIAGNOSIS — F17200 Nicotine dependence, unspecified, uncomplicated: Secondary | ICD-10-CM | POA: Insufficient documentation

## 2022-12-20 DIAGNOSIS — E782 Mixed hyperlipidemia: Secondary | ICD-10-CM | POA: Insufficient documentation

## 2022-12-20 DIAGNOSIS — I10 Essential (primary) hypertension: Secondary | ICD-10-CM | POA: Insufficient documentation

## 2022-12-20 DIAGNOSIS — R002 Palpitations: Secondary | ICD-10-CM | POA: Insufficient documentation

## 2022-12-20 DIAGNOSIS — J439 Emphysema, unspecified: Secondary | ICD-10-CM | POA: Insufficient documentation

## 2022-12-20 DIAGNOSIS — I714 Abdominal aortic aneurysm, without rupture, unspecified: Secondary | ICD-10-CM | POA: Insufficient documentation

## 2022-12-20 DIAGNOSIS — I739 Peripheral vascular disease, unspecified: Secondary | ICD-10-CM | POA: Insufficient documentation

## 2022-12-20 DIAGNOSIS — I700 Atherosclerosis of aorta: Secondary | ICD-10-CM | POA: Insufficient documentation

## 2022-12-20 DIAGNOSIS — J449 Chronic obstructive pulmonary disease, unspecified: Secondary | ICD-10-CM | POA: Insufficient documentation

## 2022-12-20 DIAGNOSIS — N529 Male erectile dysfunction, unspecified: Secondary | ICD-10-CM | POA: Insufficient documentation

## 2022-12-20 DIAGNOSIS — Z6828 Body mass index (BMI) 28.0-28.9, adult: Secondary | ICD-10-CM | POA: Insufficient documentation

## 2022-12-20 DIAGNOSIS — I7143 Infrarenal abdominal aortic aneurysm, without rupture: Secondary | ICD-10-CM | POA: Insufficient documentation

## 2022-12-24 ENCOUNTER — Ambulatory Visit: Payer: Medicare PPO

## 2022-12-24 ENCOUNTER — Telehealth: Payer: Self-pay

## 2022-12-24 VITALS — BP 164/86 | HR 96 | Ht 74.0 in | Wt 218.4 lb

## 2022-12-24 DIAGNOSIS — E782 Mixed hyperlipidemia: Secondary | ICD-10-CM

## 2022-12-24 DIAGNOSIS — R072 Precordial pain: Secondary | ICD-10-CM

## 2022-12-24 DIAGNOSIS — I493 Ventricular premature depolarization: Secondary | ICD-10-CM

## 2022-12-24 DIAGNOSIS — I10 Essential (primary) hypertension: Secondary | ICD-10-CM | POA: Diagnosis not present

## 2022-12-24 DIAGNOSIS — R002 Palpitations: Secondary | ICD-10-CM

## 2022-12-24 HISTORY — DX: Precordial pain: R07.2

## 2022-12-24 HISTORY — DX: Ventricular premature depolarization: I49.3

## 2022-12-24 MED ORDER — METOPROLOL TARTRATE 50 MG PO TABS: ORAL_TABLET | ORAL | 0 refills | Status: DC

## 2022-12-24 MED ORDER — CARVEDILOL 6.25 MG PO TABS: 6.2500 mg | ORAL_TABLET | Freq: Two times a day (BID) | ORAL | 3 refills | Status: DC

## 2022-12-24 NOTE — Assessment & Plan Note (Signed)
Suboptimal control. Currently on amlodipine olmesartan combination medication [10 mg / 40 mg].  Continue with the current dose.  Will further add a beta-blocker.  He seems to be well-controlled from his COPD symptoms hence we will use a nonselective beta-blocker to help with both blood pressure control and suppressing ventricular ectopy. Add carvedilol 6.25 mg twice daily.  If he is unable to tolerate this due to his underlying COPD, will switch to cardioselective beta-blocker such as metoprolol.

## 2022-12-24 NOTE — Assessment & Plan Note (Signed)
Atypical.  More likely related to the ectopic beats and runs of ectopic beats.  With the burden of ventricular ectopy and runs of NSVT, will need ischemic workup.  Transthoracic echocardiogram and CT coronary angiogram to be ordered as reviewed above.

## 2022-12-24 NOTE — Assessment & Plan Note (Signed)
Optimal control on recent lipid panel from 11-26-2022 LDL 56, HDL 34, total cholesterol 161, triglycerides 131.  Continue with atorvastatin 40 mg once daily.

## 2022-12-24 NOTE — Progress Notes (Addendum)
Cardiology Consultation:    Date:  12/24/2022   ID:  MARRION ACCOMANDO, DOB 1945-04-23, MRN [id number]  PCP:  Lise Auer, MD  Cardiologist:  Marlyn Corporal Bernis Stecher, MD  Vascular surgeon: Dr. Sherald Hess, last visit Aug 13, 2022.  Referring MD: Lise Auer, MD   Chest discomfort and palpitations.  Runs of nonsustained ventricular tachycardia   ASSESSMENT AND PLAN:   Mr. Wassink is a 77 year old male with history of hypertension, peripheral vascular disease s/p angioplasty of femoral arteries in 2017, COPD, ongoing tobacco use, hyperlipidemia, abdominal aortic aneurysm [facility] May 2024], descending thoracic aorta dilatation [CTA chest October 2023], daily alcohol use, now presenting for symptoms of atypical chest pain, abnormal event monitor findings showing 5.9% ventricular ectopy burden and runs of nonsustained ventricular tachycardia up to 18 beats duration lasting 8.8 seconds.  Problem List Items Addressed This Visit     Benign essential hypertension    Suboptimal control. Currently on amlodipine olmesartan combination medication [10 mg / 40 mg].  Continue with the current dose.  Will further add a beta-blocker.  He seems to be well-controlled from his COPD symptoms hence we will use a nonselective beta-blocker to help with both blood pressure control and suppressing ventricular ectopy. Add carvedilol 6.25 mg twice daily.  If he is unable to tolerate this due to his underlying COPD, will switch to cardioselective beta-blocker such as metoprolol.      Relevant Medications   carvedilol (COREG) 6.25 MG tablet   metoprolol tartrate (LOPRESSOR) 50 MG tablet   Intermittent palpitations - Primary    Appears related to ventricular and supraventricular ectopy burden. Beta-blocker being initiated with carvedilol as above.      Relevant Medications   carvedilol (COREG) 6.25 MG tablet   Other Relevant Orders   EKG 12-Lead (Completed)   ECHOCARDIOGRAM COMPLETE   Mixed  hyperlipidemia    Optimal control on recent lipid panel from 11-26-2022 LDL 56, HDL 34, total cholesterol 409, triglycerides 131.  Continue with atorvastatin 40 mg once daily.      Relevant Medications   carvedilol (COREG) 6.25 MG tablet   metoprolol tartrate (LOPRESSOR) 50 MG tablet   Precordial pain    Atypical.  More likely related to the ectopic beats and runs of ectopic beats.  With the burden of ventricular ectopy and runs of NSVT, will need ischemic workup.  Transthoracic echocardiogram and CT coronary angiogram to be ordered as reviewed above.      Relevant Orders   CT CORONARY MORPH W/CTA COR W/SCORE W/CA W/CM &/OR WO/CM   Frequent PVCs 5.9% burden by event monitor 11-26-2022; runs of nonsustained ventricular tachycardia up to 18 beats lasting 8.8 seconds    Not markedly symptomatic from these other than awareness of extra beats or skipped beats at times in association with atypical chest discomfort symptoms.  Discussed with him nature of these extra beats and potential triggers and underlying causes such as ischemia or structural issues with the heart..  Advised to avoid stimulants and cardiotoxic medications or substances. Advised to abstain from alcohol.  He is agreeable.  Will proceed with cardiac structural and functional assessment with a transthoracic echocardiogram.  Given the nature of his cardiovascular risk factors would recommend proceeding with coronary artery disease assessment for obstructive disease with CTA coronary angiogram.        Relevant Medications   carvedilol (COREG) 6.25 MG tablet   metoprolol tartrate (LOPRESSOR) 50 MG tablet   Advised to continue closely following up with vascular  surgeon with regards to peripheral vascular disease, abdominal aortic aneurysm and thoracic aortic aneurysm.  Return to clinic for follow-up in 4 weeks. He has a potential trip to [place] in November.  Will review the results and make a decision about proceeding with  the trip based on the findings.  Addendum: (01/01/2023): -Will remember to tolerate carvedilol. - Started metoprolol tartrate. -  Today was unable to proceed with CT coronary angiogram due to frequent ventricular ectopy  - will escalate Betablocker therapy, metoprolol tartrate, titrate up the dose to 50mg  BID and see if the test can be rescheduled in a week. If still limited due to ventricular ectopy will consider coronary angiogram by cardiac catheterization.   History of Present Illness:    DORIEN MAYOTTE is a 77 y.o. male who is being seen today for the evaluation of palpitations at the request of Lise Auer, MD.  Has history of hypertension, peripheral vascular disease s/p angioplasty of femoral arteries in 2017 bilaterally, COPD, erectile dysfunction, hyperlipidemia, smokes tobacco, abdominal aortic aneurysm, descending thoracic aorta aneurysm measuring up to 4.2 cm in size, daily alcohol use, lung nodule on CT imaging in 2017. Denies any prior history of CAD, CHF, MI, CVA. Remote stress test from October 2011 was reportedly normal with no mild perfusion findings suggestive of ischemia, EF 50%.  Pleasant gentleman here for the visit by himself.  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  Reports episodes of atypical chest discomfort, occurring randomly, at times during meals while watching TV.  Lasts for few minutes and resolves spontaneously.  No specific triggers.  Associated with mild shortness of breath and sensation of palpitations.  Otherwise he does not report any other palpitations.  Routinely used Kardia mobile device at home, and the tracings on occasions noted to be atrial fibrillation, notably when he stands up to take the readings.  Once he sits down it does show sinus beat..  Reviewed these today at the office, associated with significant artifact.  In this context he had an event monitor study done by the PCP.  14-day event monitor from 11-26-2022  available to review notes sinus rhythm predominantly with average heart rate 84/min [ranging from 57 bpm to 112 bpm].  Frequent ventricular ectopy burden 5.9%.  Longest run of nonsustained ventricular tachycardia was 18 beats duration, this occurred on 11-28-2022 at 2:21 PM lasting for 8.8 seconds. There were up to 137 different runs of nonsustained ventricular tachycardia observed. Supraventricular ectopy burden was 1.5% the longest run of supraventricular tachycardia was 11 beats duration. No evidence of atrial fibrillation. Patient triggered was noted 1 time associated with sinus rhythm.  3 other entries on the diary correlated with supraventricular and ventricular ectopy beats and at times with ventricular bigeminy. Overall abnormal event monitor results for frequent ventricular ectopy burden with runs of NSVT up to 18 beats. Tracings from the event monitor reviewed.   He denies any significant symptoms such as lightheadedness, dizziness, syncope or near syncope.  Denies any orthopnea.  Does tend to feel skipped beats or extra beats at times when he lays on left side compared to the right.  No pedal edema.  No significant weight changes.  Able to ambulate up to half a mile 2 times a week at the [facility], outdoors.  Reports no symptoms of chest pain or shortness of breath with that activity. Denies any flareup of COPD.  Uses albuterol occasionally as needed.  Uses Breo Ellipta regularly.  Continues to smoke  up to 1.5 pack a day. Consumes alcohol 1 drink every night. No recreational drug use such as marijuana cocaine or heroin.  Good compliance with medications including aspirin, Plavix given his history of angioplasty of the femoral vessels.   Cholesterol panel from 11-26-2022  total cholesterol 116, triglycerides 131, HDL 34, LDL 56 Hemoglobin 19.1, hematocrit 41, WBC 7.3, platelets 265 Electrolytes with sodium 141, potassium 3.7, BUN 9, creatinine 0.8, EGFR greater than 60 Normal  transaminases  EKG in the clinic today shows sinus rhythm with frequent ventricular ectopy isolated.  Heart rate 96/min, PR interval mildly prolonged to 32 ms.  Anteroseptal Q waves.  Abdominal aortic aneurysm measuring 4.76 cm x 4.76 cm in the mid segment on 08-13-2022.  In comparison January 22, 2022 this was 4.75 cm x 4.35 cm.  Vascular surgeon noted a CTA chest from October 2023, results of which are not available for my own review. I was able to review the CTA chest from October 2022 which reported ectatic thoracic aorta 3.7 cm, fusiform aneurysmal dilatation of the descending thoracic aorta 4.5 cm diameter.                                                                                                                                        Past Medical History:  Diagnosis Date   AAA (abdominal aortic aneurysm) (HCC)    being monitored by vascular MD, currently 4.5cm   AAA (abdominal aortic aneurysm) without rupture (HCC) 01/16/2021   Aneurysm of infrarenal abdominal aorta (HCC)    Aortic atherosclerosis (HCC)    Benign essential hypertension    BMI 28.0-28.9,adult    COPD (chronic obstructive pulmonary disease) (HCC)    COPD with emphysema (HCC)    Descending thoracic aortic aneurysm (HCC)    4.3cm, monitored yearly by vascular   ED (erectile dysfunction)    Heavy smoker    smokes 1-11/2 ppd   Hypertension    Intermittent palpitations    Mixed hyperlipidemia    Overgrown toenails 11/25/2018   Peripheral vascular disease of lower extremity [facility])     Past Surgical History:  Procedure Laterality Date   COLONOSCOPY     DUPUYTREN CONTRACTURE RELEASE Right 04/05/2021   Procedure: EXCISION DUPUYTREN'S CONTRACTURES RIGHT HAND, LONG FINGER AND THUMB/INDEX WEBSPACE;  Surgeon: Betha Loa, MD;  Location: [facility];  Service: Orthopedics;  Laterality: Right;   HAND SURGERY Bilateral    IR RADIOLOGIST EVAL & MGMT  11/06/2021    Current Medications: Current Meds   Medication Sig   albuterol (VENTOLIN HFA) 108 (90 Base) MCG/ACT inhaler Inhale 2 puffs into the lungs daily.   amLODipine-olmesartan (AZOR) 10-40 MG tablet Take 1 tablet by mouth daily.   aspirin 81 MG EC tablet Take 81 mg by mouth daily.   atorvastatin (LIPITOR) 40 MG tablet Take 40 mg by mouth daily.   carvedilol (COREG) 6.25 MG tablet Take 1  tablet (6.25 mg total) by mouth 2 (two) times daily.   clopidogrel (PLAVIX) 75 MG tablet Take 75 mg by mouth daily.   doxazosin (CARDURA) 1 MG tablet Take 1 mg by mouth at bedtime.   fluticasone furoate-vilanterol (BREO ELLIPTA) 100-25 MCG/ACT AEPB Inhale 2 puffs into the lungs daily.   metoprolol tartrate (LOPRESSOR) 50 MG tablet Take 1 tablet the morning of CT scan 2 hours prior to test     Allergies:   Patient has no active allergies.   Social History   Socioeconomic History   Marital status: Divorced    Spouse name: Not on file   Number of children: Not on file   Years of education: Not on file   Highest education level: Not on file  Occupational History   Not on file  Tobacco Use   Smoking status: Every Day    Current packs/day: 1.50    Types: Cigarettes   Smokeless tobacco: Former  Substance and Sexual Activity   Alcohol use: Yes    Comment: drinks 1 burbon nightly   Drug use: Never   Sexual activity: Yes  Other Topics Concern   Not on file  Social History Narrative   Not on file   Social Determinants of Health   Financial Resource Strain: Not on file  Food Insecurity: Not on file  Transportation Needs: Not on file  Physical Activity: Not on file  Stress: Not on file  Social Connections: Not on file     Family History: The patient's family history includes Cancer in his father and mother; Heart disease in his mother. ROS:   Please see the history of present illness.    All 14 point review of systems negative except as described per history of present illness.  EKGs/Labs/Other Studies Reviewed:    The following  studies were reviewed today:   EKG:  EKG Interpretation Date/Time:  Tuesday December 24 2022 10:51:42 EDT Ventricular Rate:  96 PR Interval:  232 QRS Duration:  100 QT Interval:  384 QTC Calculation: 485 R Axis:   48  Text Interpretation: Sinus rhythm with 1st degree A-V block with occasional Premature ventricular complexes and Fusion complexes Possible Anterior infarct , age undetermined ST & T wave abnormality, consider inferior ischemia Abnormal ECG When compared with ECG of 02-Apr-2021 11:00, Fusion complexes are now Present T wave inversion now evident in Inferior leads Confirmed by Huntley Dec reddy [phone]) on 12/24/2022 11:10:54 AM    Recent Labs: No results found for requested labs within last 365 days.  Recent Lipid Panel No results found for: "CHOL", "TRIG", "HDL", "CHOLHDL", "VLDL", "LDLCALC", "LDLDIRECT"  Physical Exam:    VS:  BP (!) 164/86   Pulse 96   Ht 6\' 2"  (1.88 m)   Wt 218 lb 6.4 oz (99.1 kg)   SpO2 94%   BMI 28.04 kg/m     Wt Readings from Last 3 Encounters:  12/24/22 218 lb 6.4 oz (99.1 kg)  08/13/22 224 lb (101.6 kg)  01/22/22 219 lb (99.3 kg)     GENERAL:  Well nourished, well developed in no acute distress NECK: No JVD; No carotid bruits CARDIAC: RRR, S1 and S2 present, no murmurs, no rubs, no gallops CHEST:  Clear to auscultation without rales, wheezing or rhonchi  Extremities: No pitting pedal edema. Pulses bilaterally symmetric with radial 2+ and dorsalis pedis 2+ NEUROLOGIC:  Alert and oriented x 3  Medication Adjustments/Labs and Tests Ordered: Current medicines are reviewed at length with the patient today.  Concerns regarding medicines are outlined above.  Orders Placed This Encounter  Procedures   CT CORONARY MORPH W/CTA COR W/SCORE W/CA W/CM &/OR WO/CM   EKG 12-Lead   ECHOCARDIOGRAM COMPLETE   Meds ordered this encounter  Medications   carvedilol (COREG) 6.25 MG tablet    Sig: Take 1 tablet (6.25 mg total) by mouth 2 (two)  times daily.    Dispense:  180 tablet    Refill:  3   metoprolol tartrate (LOPRESSOR) 50 MG tablet    Sig: Take 1 tablet the morning of CT scan 2 hours prior to test    Dispense:  1 tablet    Refill:  0    Signed, Jamayia Croker reddy Jyll Tomaro, MD, MPH, [facility] 12/24/2022 12:12 PM    [facility]

## 2022-12-24 NOTE — Telephone Encounter (Signed)
Called PCP- [facility] to get most recent labs

## 2022-12-24 NOTE — Patient Instructions (Addendum)
Medication Instructions:   START: Carvedilol 6.25mg  1 tablet twice daily  TAKE: Metoprolol Tartrate 50mg  2 hours prior to CT Scan   Lab Work: None Ordered If you have labs (blood work) drawn today and your tests are completely normal, you will receive your results only by: MyChart Message (if you have MyChart) OR A paper copy in the mail If you have any lab test that is abnormal or we need to change your treatment, we will call you to review the results.   Testing/Procedures:  Your physician has requested that you have an echocardiogram. Echocardiography is a painless test that uses sound waves to create images of your heart. It provides your doctor with information about the size and shape of your heart and how well your heart's chambers and valves are working. This procedure takes approximately one hour. There are no restrictions for this procedure. Please do NOT wear cologne, perfume, aftershave, or lotions (deodorant is allowed). Please arrive 15 minutes prior to your appointment time.   Your cardiac CT will be scheduled at one of the below locations:   [facility] [address] [place], [place] [address] [phone]   If scheduled at [facility], please arrive at the [facility] and Children's Entrance (Entrance C2) of [facility] 30 minutes prior to test start time. You can use the FREE valet parking offered at entrance C (encouraged to control the heart rate for the test)  Proceed to the [facility] Radiology Department (first floor) to check-in and test prep.  All radiology patients and guests should use entrance C2 at [facility], accessed from [facility], even though the hospital's physical address listed is [address].    If scheduled at [facility], please arrive 15 mins early for check-in and test prep.  Please follow these instructions carefully (unless otherwise  directed):  Hold all erectile dysfunction medications at least 3 days (72 hrs) prior to test.  On the Night Before the Test: Be sure to Drink plenty of water. Do not consume any caffeinated/decaffeinated beverages or chocolate 12 hours prior to your test. Do not take any antihistamines 12 hours prior to your test.   On the Day of the Test: Drink plenty of water until 1 hour prior to the test. Do not eat any food 4 hours prior to the test. You may take your regular medications prior to the test.  Take metoprolol (Lopressor) two hours prior to test.        After the Test: Drink plenty of water. After receiving IV contrast, you may experience a mild flushed feeling. This is normal. On occasion, you may experience a mild rash up to 24 hours after the test. This is not dangerous. If this occurs, you can take Benadryl 25 mg and increase your fluid intake. If you experience trouble breathing, this can be serious. If it is severe call 911 IMMEDIATELY. If it is mild, please call our office. If you take any of these medications: Glipizide/Metformin, Avandament, Glucavance, please do not take 48 hours after completing test unless otherwise instructed.  We will call to schedule your test 2-4 weeks out understanding that some insurance companies will need an authorization prior to the service being performed.   For non-scheduling related questions, please contact the cardiac imaging nurse navigator should you have any questions/concerns: Rockwell Alexandria, Cardiac Imaging Nurse Navigator Larey Brick, Cardiac Imaging Nurse Navigator [facility] Direct Office Dial:  [phone]   For scheduling needs, including cancellations and rescheduling, please call [place], [phone].    Follow-Up: At [facility], you and your health needs are our priority.  As part of our continuing mission to provide you with exceptional heart care, we have created designated Provider  Care Teams.  These Care Teams include your primary Cardiologist (physician) and Advanced Practice Providers (APPs -  Physician Assistants and Nurse Practitioners) who all work together to provide you with the care you need, when you need it.  We recommend signing up for the patient portal called "MyChart".  Sign up information is provided on this After Visit Summary.  MyChart is used to connect with patients for Virtual Visits (Telemedicine).  Patients are able to view lab/test results, encounter notes, upcoming appointments, etc.  Non-urgent messages can be sent to your provider as well.   To learn more about what you can do with MyChart, go to [email or web address].    Your next appointment:   1 month(s)  The format for your next appointment:   In Person  Provider:   Huntley Dec, MD    Other Instructions NA

## 2022-12-24 NOTE — Assessment & Plan Note (Signed)
Appears related to ventricular and supraventricular ectopy burden. Beta-blocker being initiated with carvedilol as above.

## 2022-12-24 NOTE — Assessment & Plan Note (Addendum)
Not markedly symptomatic from these other than awareness of extra beats or skipped beats at times in association with atypical chest discomfort symptoms.  Discussed with him nature of these extra beats and potential triggers and underlying causes such as ischemia or structural issues with the heart..  Advised to avoid stimulants and cardiotoxic medications or substances. Advised to abstain from alcohol.  He is agreeable.  Will proceed with cardiac structural and functional assessment with a transthoracic echocardiogram.  Given the nature of his cardiovascular risk factors would recommend proceeding with coronary artery disease assessment for obstructive disease with CTA coronary angiogram.

## 2022-12-25 ENCOUNTER — Telehealth: Payer: Self-pay

## 2022-12-25 DIAGNOSIS — R002 Palpitations: Secondary | ICD-10-CM

## 2022-12-25 MED ORDER — CARVEDILOL 3.125 MG PO TABS: 6.2500 mg | ORAL_TABLET | Freq: Two times a day (BID) | ORAL | Status: DC

## 2022-12-25 NOTE — Telephone Encounter (Signed)
Spoke with pt who states that he took one dose of Carvedilol yesterday at 1:30 and around 5:00 he was having trouble breathing which lasted into the night. Pt states the sx were resolved this am when he got up. Please advise

## 2022-12-25 NOTE — Telephone Encounter (Signed)
Pt c/o medication issue:  1. Name of Medication:   carvedilol (COREG) 6.25 MG tablet    2. How are you currently taking this medication (dosage and times per day)? As written  3. Are you having a reaction (difficulty breathing--STAT)? no  4. What is your medication issue? Pt since taking this medication, whenever walking around the house he started breathing heavily  and it was very uncomfortable. He states he has not taken it today. Please advise.

## 2022-12-25 NOTE — Addendum Note (Signed)
Addended by: Eleonore Chiquito on: 12/25/2022 05:07 PM   Modules accepted: Orders

## 2022-12-25 NOTE — Telephone Encounter (Signed)
Recommendations reviewed with pt as per Madireddy's note.  Pt verbalized understanding and had no additional questions.

## 2022-12-26 NOTE — Telephone Encounter (Signed)
Here is the information on my blood pressure you requested after reducing my heart medication today.  I hope it helps.    Brian Ibarra  Medical Information - Blood pressure data Oct. 2-3 2024  Blood pressure before resuming pill 12-25-2022 - 20 minutes after eating dinner.  6:26 PM Oct. 2  156/84 pulse 83 pm  6:27 PM  162/92 pulse 80 pm    6:32 PM  165/96  Pulse 81 PM  Took  pill 6:33 PM    9:30 PM  172/109  Pulse 77 PM  (:50 PM  159/91  Pulse 79  Started to heavy breath near this time with little activity.  Breathing slight heavy.    12-26-2022  8:115 AM  Took  pill 11:21 AM  158/86  Pulse 70  Blood oxygen 95    1:50 PM  After shopping at [facility]  139/74  Pulse 84  Blood Oxygen L finger 97, Right finger 93

## 2022-12-30 MED ORDER — METOPROLOL TARTRATE 25 MG PO TABS: 25.0000 mg | ORAL_TABLET | Freq: Two times a day (BID) | ORAL | 3 refills | Status: DC

## 2022-12-31 ENCOUNTER — Encounter (HOSPITAL_COMMUNITY): Payer: Self-pay

## 2023-01-01 ENCOUNTER — Encounter (HOSPITAL_COMMUNITY): Payer: Self-pay

## 2023-01-01 ENCOUNTER — Ambulatory Visit (HOSPITAL_COMMUNITY)
Admission: RE | Admit: 2023-01-01 | Discharge: 2023-01-01 | Disposition: A | Discharge status: Home / Self Care | Payer: Medicare PPO | Source: Ambulatory Visit

## 2023-01-01 DIAGNOSIS — R072 Precordial pain: Secondary | ICD-10-CM

## 2023-01-01 MED ORDER — NITROGLYCERIN 0.4 MG SL SUBL: 0.8000 mg | SUBLINGUAL_TABLET | Freq: Once | SUBLINGUAL | Status: DC

## 2023-01-01 MED ORDER — METOPROLOL TARTRATE 5 MG/5ML IV SOLN
INTRAVENOUS | Status: AC
  Filled 2023-01-01: qty 10

## 2023-01-01 MED ORDER — NITROGLYCERIN 0.4 MG SL SUBL
SUBLINGUAL_TABLET | SUBLINGUAL | Status: AC
  Filled 2023-01-01: qty 2

## 2023-01-03 ENCOUNTER — Other Ambulatory Visit: Payer: Self-pay

## 2023-01-03 DIAGNOSIS — I7143 Infrarenal abdominal aortic aneurysm, without rupture: Secondary | ICD-10-CM

## 2023-01-09 ENCOUNTER — Telehealth: Payer: Self-pay

## 2023-01-09 DIAGNOSIS — R072 Precordial pain: Secondary | ICD-10-CM

## 2023-01-09 NOTE — Telephone Encounter (Signed)
Pt requested CT angio at [facility]. Test ordered. Instructions sent by My Chart. Pre Cert sent.

## 2023-01-13 MED ORDER — HYDROCHLOROTHIAZIDE 25 MG PO TABS: 25.0000 mg | ORAL_TABLET | Freq: Every day | ORAL | 3 refills | Status: DC

## 2023-01-17 DIAGNOSIS — Z720 Tobacco use: Secondary | ICD-10-CM | POA: Diagnosis not present

## 2023-01-17 DIAGNOSIS — J441 Chronic obstructive pulmonary disease with (acute) exacerbation: Secondary | ICD-10-CM | POA: Diagnosis not present

## 2023-01-17 DIAGNOSIS — M79652 Pain in left thigh: Secondary | ICD-10-CM | POA: Diagnosis not present

## 2023-01-17 DIAGNOSIS — Z7902 Long term (current) use of antithrombotics/antiplatelets: Secondary | ICD-10-CM | POA: Diagnosis not present

## 2023-01-17 DIAGNOSIS — W19XXXA Unspecified fall, initial encounter: Secondary | ICD-10-CM | POA: Diagnosis not present

## 2023-01-17 DIAGNOSIS — F1010 Alcohol abuse, uncomplicated: Secondary | ICD-10-CM | POA: Diagnosis not present

## 2023-01-17 DIAGNOSIS — S72002A Fracture of unspecified part of neck of left femur, initial encounter for closed fracture: Secondary | ICD-10-CM | POA: Diagnosis not present

## 2023-01-17 DIAGNOSIS — E876 Hypokalemia: Secondary | ICD-10-CM | POA: Diagnosis not present

## 2023-01-17 DIAGNOSIS — M25552 Pain in left hip: Secondary | ICD-10-CM | POA: Diagnosis not present

## 2023-01-17 DIAGNOSIS — Z7982 Long term (current) use of aspirin: Secondary | ICD-10-CM | POA: Diagnosis not present

## 2023-01-17 DIAGNOSIS — S7292XA Unspecified fracture of left femur, initial encounter for closed fracture: Secondary | ICD-10-CM | POA: Diagnosis not present

## 2023-01-17 DIAGNOSIS — F17210 Nicotine dependence, cigarettes, uncomplicated: Secondary | ICD-10-CM | POA: Diagnosis not present

## 2023-01-17 DIAGNOSIS — S72012A Unspecified intracapsular fracture of left femur, initial encounter for closed fracture: Secondary | ICD-10-CM | POA: Diagnosis not present

## 2023-01-17 DIAGNOSIS — F1020 Alcohol dependence, uncomplicated: Secondary | ICD-10-CM | POA: Diagnosis not present

## 2023-01-17 DIAGNOSIS — I10 Essential (primary) hypertension: Secondary | ICD-10-CM | POA: Diagnosis not present

## 2023-01-17 DIAGNOSIS — Z96642 Presence of left artificial hip joint: Secondary | ICD-10-CM | POA: Diagnosis not present

## 2023-01-17 DIAGNOSIS — M85852 Other specified disorders of bone density and structure, left thigh: Secondary | ICD-10-CM | POA: Diagnosis not present

## 2023-01-17 DIAGNOSIS — M79605 Pain in left leg: Secondary | ICD-10-CM | POA: Diagnosis not present

## 2023-01-17 DIAGNOSIS — Z79899 Other long term (current) drug therapy: Secondary | ICD-10-CM | POA: Diagnosis not present

## 2023-01-17 DIAGNOSIS — R079 Chest pain, unspecified: Secondary | ICD-10-CM | POA: Diagnosis not present

## 2023-01-17 DIAGNOSIS — I700 Atherosclerosis of aorta: Secondary | ICD-10-CM | POA: Diagnosis not present

## 2023-01-17 DIAGNOSIS — R0902 Hypoxemia: Secondary | ICD-10-CM | POA: Diagnosis not present

## 2023-01-22 DIAGNOSIS — S72012D Unspecified intracapsular fracture of left femur, subsequent encounter for closed fracture with routine healing: Secondary | ICD-10-CM | POA: Diagnosis not present

## 2023-01-22 DIAGNOSIS — I7123 Aneurysm of the descending thoracic aorta, without rupture: Secondary | ICD-10-CM | POA: Diagnosis not present

## 2023-01-22 DIAGNOSIS — I10 Essential (primary) hypertension: Secondary | ICD-10-CM | POA: Diagnosis not present

## 2023-01-22 DIAGNOSIS — J441 Chronic obstructive pulmonary disease with (acute) exacerbation: Secondary | ICD-10-CM | POA: Diagnosis not present

## 2023-01-22 DIAGNOSIS — I739 Peripheral vascular disease, unspecified: Secondary | ICD-10-CM | POA: Diagnosis not present

## 2023-01-22 DIAGNOSIS — I700 Atherosclerosis of aorta: Secondary | ICD-10-CM | POA: Diagnosis not present

## 2023-01-22 DIAGNOSIS — E782 Mixed hyperlipidemia: Secondary | ICD-10-CM | POA: Diagnosis not present

## 2023-01-22 DIAGNOSIS — J432 Centrilobular emphysema: Secondary | ICD-10-CM | POA: Diagnosis not present

## 2023-01-22 DIAGNOSIS — I7143 Infrarenal abdominal aortic aneurysm, without rupture: Secondary | ICD-10-CM | POA: Diagnosis not present

## 2023-01-23 DIAGNOSIS — E782 Mixed hyperlipidemia: Secondary | ICD-10-CM | POA: Diagnosis not present

## 2023-01-23 DIAGNOSIS — I700 Atherosclerosis of aorta: Secondary | ICD-10-CM | POA: Diagnosis not present

## 2023-01-23 DIAGNOSIS — J441 Chronic obstructive pulmonary disease with (acute) exacerbation: Secondary | ICD-10-CM | POA: Diagnosis not present

## 2023-01-23 DIAGNOSIS — I7143 Infrarenal abdominal aortic aneurysm, without rupture: Secondary | ICD-10-CM | POA: Diagnosis not present

## 2023-01-23 DIAGNOSIS — I10 Essential (primary) hypertension: Secondary | ICD-10-CM | POA: Diagnosis not present

## 2023-01-23 DIAGNOSIS — I7123 Aneurysm of the descending thoracic aorta, without rupture: Secondary | ICD-10-CM | POA: Diagnosis not present

## 2023-01-23 DIAGNOSIS — S72012D Unspecified intracapsular fracture of left femur, subsequent encounter for closed fracture with routine healing: Secondary | ICD-10-CM | POA: Diagnosis not present

## 2023-01-23 DIAGNOSIS — J432 Centrilobular emphysema: Secondary | ICD-10-CM | POA: Diagnosis not present

## 2023-01-23 DIAGNOSIS — I739 Peripheral vascular disease, unspecified: Secondary | ICD-10-CM | POA: Diagnosis not present

## 2023-01-28 ENCOUNTER — Other Ambulatory Visit: Payer: Self-pay

## 2023-01-28 DIAGNOSIS — R079 Chest pain, unspecified: Secondary | ICD-10-CM | POA: Diagnosis not present

## 2023-01-28 DIAGNOSIS — I7143 Infrarenal abdominal aortic aneurysm, without rupture: Secondary | ICD-10-CM | POA: Diagnosis not present

## 2023-01-28 DIAGNOSIS — E782 Mixed hyperlipidemia: Secondary | ICD-10-CM | POA: Diagnosis not present

## 2023-01-28 DIAGNOSIS — I739 Peripheral vascular disease, unspecified: Secondary | ICD-10-CM | POA: Diagnosis not present

## 2023-01-28 DIAGNOSIS — J441 Chronic obstructive pulmonary disease with (acute) exacerbation: Secondary | ICD-10-CM | POA: Diagnosis not present

## 2023-01-28 DIAGNOSIS — I700 Atherosclerosis of aorta: Secondary | ICD-10-CM | POA: Diagnosis not present

## 2023-01-28 DIAGNOSIS — I7123 Aneurysm of the descending thoracic aorta, without rupture: Secondary | ICD-10-CM | POA: Diagnosis not present

## 2023-01-28 DIAGNOSIS — J432 Centrilobular emphysema: Secondary | ICD-10-CM | POA: Diagnosis not present

## 2023-01-28 DIAGNOSIS — I10 Essential (primary) hypertension: Secondary | ICD-10-CM | POA: Diagnosis not present

## 2023-01-28 DIAGNOSIS — S72012D Unspecified intracapsular fracture of left femur, subsequent encounter for closed fracture with routine healing: Secondary | ICD-10-CM | POA: Diagnosis not present

## 2023-01-28 DIAGNOSIS — I77819 Aortic ectasia, unspecified site: Secondary | ICD-10-CM | POA: Diagnosis not present

## 2023-01-29 ENCOUNTER — Ambulatory Visit: Payer: Medicare PPO

## 2023-01-29 DIAGNOSIS — R072 Precordial pain: Secondary | ICD-10-CM

## 2023-01-29 DIAGNOSIS — R002 Palpitations: Secondary | ICD-10-CM

## 2023-01-29 DIAGNOSIS — Z96642 Presence of left artificial hip joint: Secondary | ICD-10-CM | POA: Diagnosis not present

## 2023-01-29 DIAGNOSIS — Z6827 Body mass index (BMI) 27.0-27.9, adult: Secondary | ICD-10-CM | POA: Diagnosis not present

## 2023-01-29 DIAGNOSIS — I739 Peripheral vascular disease, unspecified: Secondary | ICD-10-CM | POA: Diagnosis not present

## 2023-01-29 DIAGNOSIS — D62 Acute posthemorrhagic anemia: Secondary | ICD-10-CM | POA: Diagnosis not present

## 2023-01-29 LAB — ECHOCARDIOGRAM COMPLETE
Area-P 1/2: 3.99 cm2
S' Lateral: 3.2 cm

## 2023-01-30 ENCOUNTER — Ambulatory Visit: Payer: Medicare PPO

## 2023-01-30 VITALS — BP 100/42 | HR 91 | Ht 74.0 in | Wt 208.0 lb

## 2023-01-30 DIAGNOSIS — I7143 Infrarenal abdominal aortic aneurysm, without rupture: Secondary | ICD-10-CM | POA: Diagnosis not present

## 2023-01-30 DIAGNOSIS — I2510 Atherosclerotic heart disease of native coronary artery without angina pectoris: Secondary | ICD-10-CM | POA: Diagnosis not present

## 2023-01-30 DIAGNOSIS — J441 Chronic obstructive pulmonary disease with (acute) exacerbation: Secondary | ICD-10-CM | POA: Diagnosis not present

## 2023-01-30 DIAGNOSIS — I10 Essential (primary) hypertension: Secondary | ICD-10-CM | POA: Diagnosis not present

## 2023-01-30 DIAGNOSIS — I493 Ventricular premature depolarization: Secondary | ICD-10-CM

## 2023-01-30 DIAGNOSIS — I7123 Aneurysm of the descending thoracic aorta, without rupture: Secondary | ICD-10-CM

## 2023-01-30 DIAGNOSIS — S72012D Unspecified intracapsular fracture of left femur, subsequent encounter for closed fracture with routine healing: Secondary | ICD-10-CM | POA: Diagnosis not present

## 2023-01-30 DIAGNOSIS — E782 Mixed hyperlipidemia: Secondary | ICD-10-CM | POA: Diagnosis not present

## 2023-01-30 DIAGNOSIS — I739 Peripheral vascular disease, unspecified: Secondary | ICD-10-CM | POA: Diagnosis not present

## 2023-01-30 DIAGNOSIS — J432 Centrilobular emphysema: Secondary | ICD-10-CM | POA: Diagnosis not present

## 2023-01-30 DIAGNOSIS — I700 Atherosclerosis of aorta: Secondary | ICD-10-CM | POA: Diagnosis not present

## 2023-01-30 HISTORY — DX: Atherosclerotic heart disease of native coronary artery without angina pectoris: I25.10

## 2023-01-30 NOTE — Assessment & Plan Note (Signed)
Mostly asymptomatic. No significant cardiac functional issues or obstructive CAD.  Continue with low-dose metoprolol to tartrate 25 mg twice daily.  At this time he was unable to confirm if he is in fact taking this dose at home as prescribed. He was advised to review his medications at home and confirm this to [place] after he goes back home today.

## 2023-01-30 NOTE — Assessment & Plan Note (Signed)
Reviewed results of the study today at the office visit. Currently taking 325 mg twice daily post hip surgery as per surgeon. After this course he should reduce the dose back down to aspirin 81 mg once daily when instructed by the surgeon. Continue atorvastatin 40 mg once daily

## 2023-01-30 NOTE — Patient Instructions (Signed)
Medication Instructions:  Your physician recommends that you continue on your current medications as directed. Please refer to the Current Medication list given to you today.  *If you need a refill on your cardiac medications before your next appointment, please call your pharmacy*   Lab Work: None If you have labs (blood work) drawn today and your tests are completely normal, you will receive your results only by: MyChart Message (if you have MyChart) OR A paper copy in the mail If you have any lab test that is abnormal or we need to change your treatment, we will call you to review the results.   Testing/Procedures: None   Follow-Up: At [facility], you and your health needs are our priority.  As part of our continuing mission to provide you with exceptional heart care, we have created designated Provider Care Teams.  These Care Teams include your primary Cardiologist (physician) and Advanced Practice Providers (APPs -  Physician Assistants and Nurse Practitioners) who all work together to provide you with the care you need, when you need it.  We recommend signing up for the patient portal called "MyChart".  Sign up information is provided on this After Visit Summary.  MyChart is used to connect with patients for Virtual Visits (Telemedicine).  Patients are able to view lab/test results, encounter notes, upcoming appointments, etc.  Non-urgent messages can be sent to your provider as well.   To learn more about what you can do with MyChart, go to [email or web address].    Your next appointment:   1 year(s)  Provider:   Huntley Dec, MD    Other Instructions None

## 2023-01-30 NOTE — Assessment & Plan Note (Addendum)
CT coronary angiogram from 01/28/2023 once again noted ectatic descending thoracic aorta aneurysm with penetrating ulcer.  Vascular surgeon office report noted a CTA chest from October 2023, results of which are not available for my own review.  I was able to review the CTA chest report from October 2022 which reported ectatic thoracic aorta 3.7 cm, fusiform aneurysmal dilatation of the descending thoracic aorta 4.5 cm diameter. Another prior CT imaging of the chest from December 17, 2019 at [facility] reported descending aorta measuring 41 x 43 mm at the level of carina with focal penetrating atheromatous ulcer.  He is pending f/u with Vascular surgeon Dr. Chestine Spore. Had CT w/o Chest study done at [facility] report pending.  Recommend close f/u with Vascular surgeon.  From cardiac standpoint okay to continue with Plavix 75 mg once daily.

## 2023-01-30 NOTE — Progress Notes (Signed)
Cardiology Consultation:    Date:  01/30/2023   ID:  Brian Ibarra, DOB [birth year], MRN [id number]  PCP:  Brian Auer, MD  Cardiologist:  Brian Corporal Jacari Iannello, MD Vascular surgeon Dr. Sherald Ibarra  Referring MD: Brian Auer, MD   No chief complaint on file.    ASSESSMENT AND PLAN:   Mr Brian Ibarra 77 year old male patient with history of hypertension, peripheral vascular disease s/p angioplasty of femoral arteries in 2017, COPD, ongoing tobacco use, hyperlipidemia, abdominal aortic aneurysm [last ultrasound May 2024] descending thoracic aorta dilatation 4.2 cm [CTA chest October 2023], Daily alcohol use, coronary atherosclerosis on CT coronary imaging from 01/29/2023. Also has history of frequent ventricular ectopy burden up to 6% on prior heart monitor, nonsustained ventricular tachycardia runs up to 18 beats lasting 8.8 seconds. S/p recent fall and left hip fracture requiring surgery, now recovering well.   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  Vascular surgeon office report noted a CTA chest from October 2023, results of which are not available for my own review.  I was able to review the CTA chest report from October 2022 which reported ectatic thoracic aorta 3.7 cm, fusiform aneurysmal dilatation of the descending thoracic aorta 4.5 cm diameter. Another prior CT imaging of the chest from December 17, 2019 at [facility] reported descending aorta measuring 41 x 43 mm at the level of carina with focal penetrating atheromatous ulcer.  He is pending f/u with Vascular surgeon Dr. Chestine Ibarra. Had CT w/o Chest study done at [facility] report pending.  Recommend close f/u with Vascular surgeon.  From cardiac standpoint okay to continue  with Plavix 75 mg once daily.       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 No significant cardiac functional issues or obstructive CAD.  Continue with low-dose metoprolol to tartrate 25 mg twice daily.  At this time he was unable to confirm if he is in fact taking this dose at home as prescribed. He was advised to review his medications at home and confirm this to [place] after he goes back home today.       Nonobstructive CAD , calcium score 175, CAD RADS 3 study, moderate stenosis of OM1, minimal stenosis LAD, CT FFR low likelihood of hemodynamically significant stenosis 01/28/2023 at [facility]    Reviewed results of the study today at the office visit. Currently taking 325 mg twice daily post hip surgery as per surgeon. After this course he should reduce the dose back down to aspirin 81 mg once daily when instructed by the surgeon. Continue atorvastatin 40 mg once daily        Relevant Orders   EKG 12-Lead (Completed)   RTC in 6 months.    History of Present Illness:    Brian Ibarra is a 77 y.o. male who is being seen today for follow-up.  PCP is Brian Auer, MD.  History of nonobstructive coronary artery disease by CT imaging 01/29/2023, hypertension, peripheral vascular disease, prior angioplasty of femoral arteries in 2017, COPD, erectile dysfunction, hyperlipidemia, smokes tobacco, abdominal aortic aneurysm, mild aneurysm of the descending thoracic aorta measuring up to 4.2 cm in  size, daily alcohol use, lung nodules on prior CT imaging, frequent ventricular ectopy up to 6% burden and short runs of NSVT on event monitor lasting up to 8.8 seconds Since his last visit with [place] he sustained a mechanical fall, sustained left hip fracture requiring arthroplasty and discharged after 4 days.   Also continues to closely follow-up with vascular surgeon with regards  to his aortic aneurysm.  Here for the visit today, accompanied by his wife. Mentions he has been feeling well no chest pain no shortness of breath. Remains in a wheelchair at this time but undergoing rehab and improving.  Remains on aspirin high-dose 325 mg twice daily foot DVT prophylaxis aspirin discharge instructions and he is pending follow-up with the surgeon.  Unclear about his medication names and doses at home at this time. He is unable to tell me if he is taking metoprolol or not.  Unclear if he is on Plavix.  In the context of his aortic aneurysm findings and possibly penetrating ulcer in the descending portion of the thoracic aorta, reasonable to continue Plavix at this time.  Will defer further continuation of Plavix to the vascular surgeon.  Mentions blood pressures at home have been good.   EKG in the clinic today shows sinus rhythm with frequent atrial ectopy, heart rate mildly tachycardic, nonspecific ST-T changes.  14-day event monitor from 11/26/2022 previously reviewed with average heart rate 84/min, frequent ventricular ectopy 5.9% burden longest run of NSVT 8.8 seconds lasting 18 beats.  Supraventricular ectopy burden 1.5%.  Echocardiogram done yesterday showed normal LVEF 60 to 65%, ascending aorta proximal portion measured 4 cm in size.  Normal diastolic function parameters.  Smokes up to a pack and a half a day and consumes up to 1 drink of alcohol a night.   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% burden by event monitor 11-26-2022; runs of nonsustained ventricular tachycardia up to 18 beats lasting 8.8 seconds 12/24/2022   Heavy smoker    smokes 1-11/2 ppd   Hypertension    Intermittent palpitations    Mixed hyperlipidemia    Overgrown toenails 11/25/2018   Peripheral vascular disease of lower extremity (HCC)    Precordial pain 12/24/2022    Past Surgical History:  Procedure Laterality Date   COLONOSCOPY     DUPUYTREN CONTRACTURE RELEASE Right 04/05/2021   Procedure: EXCISION DUPUYTREN'S CONTRACTURES RIGHT HAND, LONG FINGER AND THUMB/INDEX WEBSPACE;  Surgeon: Betha Loa, MD;  Location: [facility];  Service: Orthopedics;  Laterality: Right;   HAND SURGERY Bilateral    IR RADIOLOGIST EVAL & MGMT  11/06/2021    Current Medications: Current Meds  Medication Sig   albuterol (VENTOLIN HFA) 108 (90 Base) MCG/ACT inhaler Inhale 2 puffs into the lungs daily.   amLODipine-olmesartan (AZOR) 10-40 MG tablet Take 1 tablet by mouth daily.   atorvastatin (LIPITOR) 40 MG tablet Take 40 mg by mouth daily.   clopidogrel (PLAVIX) 75 MG tablet Take  75 mg by mouth daily.   doxazosin (CARDURA) 1 MG tablet Take 1 mg by mouth at bedtime.   hydrochlorothiazide (HYDRODIURIL) 25 MG tablet Take 1 tablet (25 mg total) by mouth daily.   STIOLTO RESPIMAT 2.5-2.5 MCG/ACT AERS Inhale 2 puffs into the lungs daily.     Allergies:   Patient has no active allergies.   Social History   Socioeconomic History   Marital status: Divorced    Spouse name: Not on file   Number of children: Not on file   Years of education: Not on file   Highest education level: Not on file  Occupational History   Not on file  Tobacco Use   Smoking status: Every Day    Current packs/day: 1.50    Types: Cigarettes   Smokeless tobacco: Former  Substance and Sexual Activity   Alcohol use: Yes    Comment: drinks 1 burbon nightly   Drug use: Never   Sexual  activity: Yes  Other Topics Concern   Not on file  Social History Narrative   Not on file   Social Determinants of Health   Financial Resource Strain: Not on file  Food Insecurity: Not on file  Transportation Needs: Not on file  Physical Activity: Not on file  Stress: Not on file  Social Connections: Not on file     Family History: The patient's family history includes Cancer in his father and mother; Heart disease in his mother. ROS:   Please see the history of present illness.    All 14 point review of systems negative except as described per history of present illness.  EKGs/Labs/Other Studies Reviewed:    The following studies were reviewed today:   EKG:       Recent Labs: No results found for requested labs within last 365 days.  Recent Lipid Panel No results found for: "CHOL", "TRIG", "HDL", "CHOLHDL", "VLDL", "LDLCALC", "LDLDIRECT"  Physical Exam:    VS:  BP (!) 100/42   Pulse 91   Ht 6\' 2"  (1.88 m)   Wt 208 lb (94.3 kg)   SpO2 91%   BMI 26.71 kg/m     Wt Readings from Last 3 Encounters:  01/30/23 208 lb (94.3 kg)  12/24/22 218 lb 6.4 oz (99.1 kg)  08/13/22 224 lb (101.6 kg)     GENERAL:  Well nourished, well developed in no acute distress NECK: No JVD; No carotid bruits CARDIAC: RRR, S1 and S2 present, no murmurs, no rubs, no gallops CHEST:  Clear to auscultation without rales, wheezing or rhonchi  Extremities: No pitting pedal edema. Pulses bilaterally symmetric with radial 2+ and dorsalis pedis 2+ NEUROLOGIC:  Alert and oriented x 3  Medication Adjustments/Labs and Tests Ordered: Current medicines are reviewed at length with the patient today.  Concerns regarding medicines are outlined above.  Orders Placed This Encounter  Procedures   EKG 12-Lead   No orders of the defined types were placed in this encounter.   Signed, Zaniyah Wernette reddy Aziya Arena, MD, MPH, [facility]. 01/30/2023 4:17 PM     [facility]

## 2023-01-31 ENCOUNTER — Telehealth: Payer: Self-pay

## 2023-01-31 NOTE — Telephone Encounter (Signed)
Returned call to [place] with PCP office. No answer. Left msg to call back.

## 2023-01-31 NOTE — Telephone Encounter (Signed)
Brian Ibarra called back stating that she got everything figured out and she's all set. Please advise

## 2023-01-31 NOTE — Telephone Encounter (Signed)
Noted  

## 2023-01-31 NOTE — Telephone Encounter (Signed)
Pt c/o medication issue:  1. Name of Medication: metoprolol tartrate (LOPRESSOR) 25 MG tablet  Carvedilol 6.25 mg tablet  2. How are you currently taking this medication (dosage and times per day)? No sure  3. Are you having a reaction (difficulty breathing--STAT)? no  4. What is your medication issue? PCP offices is calling asking which medication pt is suppose to be on. They are confused about it and so is the patient. Angelique Blonder from PCP office would like a call back,

## 2023-02-02 DIAGNOSIS — I7123 Aneurysm of the descending thoracic aorta, without rupture: Secondary | ICD-10-CM | POA: Diagnosis not present

## 2023-02-03 DIAGNOSIS — I10 Essential (primary) hypertension: Secondary | ICD-10-CM | POA: Diagnosis not present

## 2023-02-03 DIAGNOSIS — E782 Mixed hyperlipidemia: Secondary | ICD-10-CM | POA: Diagnosis not present

## 2023-02-03 DIAGNOSIS — I7123 Aneurysm of the descending thoracic aorta, without rupture: Secondary | ICD-10-CM | POA: Diagnosis not present

## 2023-02-03 DIAGNOSIS — S72012D Unspecified intracapsular fracture of left femur, subsequent encounter for closed fracture with routine healing: Secondary | ICD-10-CM | POA: Diagnosis not present

## 2023-02-03 DIAGNOSIS — I739 Peripheral vascular disease, unspecified: Secondary | ICD-10-CM | POA: Diagnosis not present

## 2023-02-03 DIAGNOSIS — I700 Atherosclerosis of aorta: Secondary | ICD-10-CM | POA: Diagnosis not present

## 2023-02-03 DIAGNOSIS — J441 Chronic obstructive pulmonary disease with (acute) exacerbation: Secondary | ICD-10-CM | POA: Diagnosis not present

## 2023-02-03 DIAGNOSIS — I7143 Infrarenal abdominal aortic aneurysm, without rupture: Secondary | ICD-10-CM | POA: Diagnosis not present

## 2023-02-03 DIAGNOSIS — J432 Centrilobular emphysema: Secondary | ICD-10-CM | POA: Diagnosis not present

## 2023-02-04 ENCOUNTER — Ambulatory Visit (HOSPITAL_COMMUNITY)
Admission: RE | Admit: 2023-02-04 | Discharge: 2023-02-04 | Disposition: A | Discharge status: Home / Self Care | Payer: Medicare PPO | Source: Ambulatory Visit | Attending: Vascular Surgery | Admitting: Vascular Surgery

## 2023-02-04 ENCOUNTER — Encounter: Payer: Self-pay | Admitting: Vascular Surgery

## 2023-02-04 ENCOUNTER — Ambulatory Visit: Payer: Medicare PPO | Admitting: Vascular Surgery

## 2023-02-04 VITALS — BP 126/60 | HR 61 | Temp 97.8°F | Resp 18 | Ht 74.0 in | Wt 208.0 lb

## 2023-02-04 DIAGNOSIS — I7143 Infrarenal abdominal aortic aneurysm, without rupture: Secondary | ICD-10-CM | POA: Diagnosis not present

## 2023-02-04 DIAGNOSIS — I700 Atherosclerosis of aorta: Secondary | ICD-10-CM | POA: Diagnosis not present

## 2023-02-04 DIAGNOSIS — J441 Chronic obstructive pulmonary disease with (acute) exacerbation: Secondary | ICD-10-CM | POA: Diagnosis not present

## 2023-02-04 DIAGNOSIS — I739 Peripheral vascular disease, unspecified: Secondary | ICD-10-CM | POA: Diagnosis not present

## 2023-02-04 DIAGNOSIS — I7123 Aneurysm of the descending thoracic aorta, without rupture: Secondary | ICD-10-CM | POA: Diagnosis not present

## 2023-02-04 DIAGNOSIS — I10 Essential (primary) hypertension: Secondary | ICD-10-CM | POA: Diagnosis not present

## 2023-02-04 DIAGNOSIS — E782 Mixed hyperlipidemia: Secondary | ICD-10-CM | POA: Diagnosis not present

## 2023-02-04 DIAGNOSIS — S72012D Unspecified intracapsular fracture of left femur, subsequent encounter for closed fracture with routine healing: Secondary | ICD-10-CM | POA: Diagnosis not present

## 2023-02-04 DIAGNOSIS — J432 Centrilobular emphysema: Secondary | ICD-10-CM | POA: Diagnosis not present

## 2023-02-04 NOTE — Progress Notes (Signed)
Patient name: Brian Ibarra MRN: [id number] DOB: 13-Aug-1945 Sex: male  REASON FOR CONSULT: 6 month follow-up 4.76 cm AAA  HPI: Brian Ibarra is a 77 y.o. male, with history of COPD, hypertension, tobacco abuse that presents 6 month follow-up of 4.76 cm AAA.   He has no new complaints today.  He is still smoking about a pack and half a day. 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  No chest pain.  No abdominal or back pain.  We are also following a 4.5 cm descending thoracic aneurysm last evaluated on 01/22/2022.  He did get a repeat CT Chest at [facility].  Past Medical History:  Diagnosis Date   AAA (abdominal aortic aneurysm) (HCC)    being monitored by vascular MD, currently 4.5cm   AAA (abdominal aortic aneurysm) without rupture (HCC) 01/16/2021   Aneurysm of infrarenal abdominal aorta (HCC)    Aortic atherosclerosis (HCC)    Benign essential hypertension    BMI 28.0-28.9,adult    COPD (chronic obstructive pulmonary disease) (HCC)    COPD with emphysema (HCC)    Descending thoracic aortic aneurysm (HCC)    4.3cm, monitored yearly by vascular   ED (erectile dysfunction)    Frequent PVCs 5.9% burden by event monitor 11-26-2022; runs of nonsustained ventricular tachycardia up to 18 beats lasting 8.8 seconds 12/24/2022   Heavy smoker    smokes 1-11/2 ppd   Hypertension    Intermittent palpitations    Mixed hyperlipidemia    Overgrown toenails 11/25/2018   Peripheral vascular disease of lower extremity (HCC)    Precordial pain 12/24/2022   PSH: None  Family History  Problem Relation Age of Onset   Heart disease Mother    Cancer Mother    Cancer Father     SOCIAL HISTORY: Social History   Socioeconomic History   Marital status: Divorced    Spouse name: Not on file   Number of children: Not on file   Years of education: Not on file   Highest education level: Not on file  Occupational History   Not on file  Tobacco Use   Smoking status: Every Day     Current packs/day: 1.50    Types: Cigarettes   Smokeless tobacco: Former  Substance and Sexual Activity   Alcohol use: Yes    Comment: drinks 1 burbon nightly   Drug use: Never   Sexual activity: Yes  Other Topics Concern   Not on file  Social History Narrative   Not on file   Social Determinants of Health   Financial Resource Strain: Not on file  Food Insecurity: Not on file  Transportation Needs: Not on file  Physical Activity: Not on file  Stress: Not on file  Social Connections: Not on file  Intimate Partner Violence: Not on file    No Active Allergies  Current Outpatient Medications  Medication Sig Dispense Refill   albuterol (VENTOLIN HFA) 108 (90 Base) MCG/ACT inhaler Inhale 2 puffs into the lungs daily.     amLODipine-olmesartan (AZOR) 10-40 MG tablet Take 1 tablet by mouth daily.     aspirin 81 MG EC tablet Take 81 mg by mouth daily. (Patient not taking: Reported on 01/30/2023)     atorvastatin (LIPITOR) 40 MG tablet Take 40 mg by mouth daily.     clopidogrel (PLAVIX) 75 MG tablet Take 75 mg by mouth daily.     doxazosin (CARDURA) 1 MG tablet Take 1 mg by mouth at bedtime.  fluticasone furoate-vilanterol (BREO ELLIPTA) 100-25 MCG/ACT AEPB Inhale 2 puffs into the lungs daily. (Patient not taking: Reported on 01/30/2023)     hydrochlorothiazide (HYDRODIURIL) 25 MG tablet Take 1 tablet (25 mg total) by mouth daily. 90 tablet 3   metoprolol tartrate (LOPRESSOR) 25 MG tablet Take 1 tablet (25 mg total) by mouth 2 (two) times daily. (Patient not taking: Reported on 01/30/2023) 180 tablet 3   STIOLTO RESPIMAT 2.5-2.5 MCG/ACT AERS Inhale 2 puffs into the lungs daily.     No current facility-administered medications for this visit.    REVIEW OF SYSTEMS:  [X]  denotes positive finding, [ ]  denotes negative finding Cardiac  Comments:  Chest pain or chest pressure:    Shortness of breath upon exertion:    Short of breath when lying flat:    Irregular heart rhythm:         Vascular    Pain in calf, thigh, or hip brought on by ambulation:    Pain in feet at night that wakes you up from your sleep:     Blood clot in your veins:    Leg swelling:         Pulmonary    Oxygen at home:    Productive cough:     Wheezing:         Neurologic    Sudden weakness in arms or legs:     Sudden numbness in arms or legs:     Sudden onset of difficulty speaking or slurred speech:    Temporary loss of vision in one eye:     Problems with dizziness:         Gastrointestinal    Blood in stool:     Vomited blood:         Genitourinary    Burning when urinating:     Blood in urine:        Psychiatric    Major depression:         Hematologic    Bleeding problems:    Problems with blood clotting too easily:        Skin    Rashes or ulcers:        Constitutional    Fever or chills:      PHYSICAL EXAM: There were no vitals filed for this visit.   GENERAL: The patient is a well-nourished male, in no acute distress. The vital signs are documented above. CARDIAC: There is a regular rate and rhythm.  VASCULAR:  Palpable femoral pulses bilateral groins PULMONARY: No respiratory distress. ABDOMEN: Soft and non-tender.  No pain with deep palpation. MUSCULOSKELETAL: There are no major deformities or cyanosis. NEUROLOGIC: No focal weakness or paresthesias are detected. SKIN: There are no ulcers or rashes noted. PSYCHIATRIC: The patient has a normal affect.  DATA:   AAA duplex today shows 4.76 cm --> 5.2 cm over past 6 months  CT chest w/o contrast 01/28/23 shows stable 4.5 cm descending thoracic aneurysm (compared to 01/14/2022)  Assessment/Plan:  77 year old male presents for 6 month follow-up of a 4.76 cm AAA.  His duplex today shows increase in his AAA to 5.2 cm  Discussed that in men we repair these at greater than 5.5 cm.  I will see him again in 6 months with EVAR duplex.  I discussed the importance of smoking cessation and keeping his blood pressure  well-controlled.  He will need a CTA for further evaluation of his anatomy if his aneurysm continues to enlarge.   He was  also due for surveillance of his descending thoracic aneurysm.  He did get a CT chest at [facility] on 01/28/2023.  I reviewed the images and this is stable at 4.5 cm.  I will repeat an image in 1 year.     [name], MD [facility] Office: [phone]

## 2023-02-14 ENCOUNTER — Other Ambulatory Visit: Payer: Self-pay

## 2023-02-14 DIAGNOSIS — I7143 Infrarenal abdominal aortic aneurysm, without rupture: Secondary | ICD-10-CM

## 2023-02-20 DIAGNOSIS — J441 Chronic obstructive pulmonary disease with (acute) exacerbation: Secondary | ICD-10-CM | POA: Diagnosis not present

## 2023-02-20 DIAGNOSIS — Z720 Tobacco use: Secondary | ICD-10-CM | POA: Diagnosis not present

## 2023-02-25 DIAGNOSIS — S72002A Fracture of unspecified part of neck of left femur, initial encounter for closed fracture: Secondary | ICD-10-CM | POA: Diagnosis not present

## 2023-02-25 DIAGNOSIS — S72009A Fracture of unspecified part of neck of unspecified femur, initial encounter for closed fracture: Secondary | ICD-10-CM | POA: Diagnosis not present

## 2023-03-04 DIAGNOSIS — Z6827 Body mass index (BMI) 27.0-27.9, adult: Secondary | ICD-10-CM | POA: Diagnosis not present

## 2023-03-04 DIAGNOSIS — G4700 Insomnia, unspecified: Secondary | ICD-10-CM | POA: Diagnosis not present

## 2023-03-04 DIAGNOSIS — M8589 Other specified disorders of bone density and structure, multiple sites: Secondary | ICD-10-CM | POA: Diagnosis not present

## 2023-03-22 DIAGNOSIS — Z720 Tobacco use: Secondary | ICD-10-CM | POA: Diagnosis not present

## 2023-03-22 DIAGNOSIS — J441 Chronic obstructive pulmonary disease with (acute) exacerbation: Secondary | ICD-10-CM | POA: Diagnosis not present

## 2023-03-25 DIAGNOSIS — R55 Syncope and collapse: Secondary | ICD-10-CM | POA: Diagnosis not present

## 2023-03-25 DIAGNOSIS — J9601 Acute respiratory failure with hypoxia: Secondary | ICD-10-CM | POA: Diagnosis not present

## 2023-03-25 DIAGNOSIS — R062 Wheezing: Secondary | ICD-10-CM | POA: Diagnosis not present

## 2023-03-25 DIAGNOSIS — R6520 Severe sepsis without septic shock: Secondary | ICD-10-CM | POA: Diagnosis not present

## 2023-03-25 DIAGNOSIS — J189 Pneumonia, unspecified organism: Secondary | ICD-10-CM | POA: Diagnosis not present

## 2023-03-25 DIAGNOSIS — J440 Chronic obstructive pulmonary disease with acute lower respiratory infection: Secondary | ICD-10-CM | POA: Diagnosis not present

## 2023-03-25 DIAGNOSIS — N390 Urinary tract infection, site not specified: Secondary | ICD-10-CM | POA: Diagnosis not present

## 2023-03-25 DIAGNOSIS — R0902 Hypoxemia: Secondary | ICD-10-CM | POA: Diagnosis not present

## 2023-03-25 DIAGNOSIS — A419 Sepsis, unspecified organism: Secondary | ICD-10-CM | POA: Diagnosis not present

## 2023-03-25 DIAGNOSIS — E860 Dehydration: Secondary | ICD-10-CM | POA: Diagnosis not present

## 2023-03-25 DIAGNOSIS — J9621 Acute and chronic respiratory failure with hypoxia: Secondary | ICD-10-CM | POA: Diagnosis not present

## 2023-03-25 DIAGNOSIS — I440 Atrioventricular block, first degree: Secondary | ICD-10-CM | POA: Diagnosis not present

## 2023-03-25 DIAGNOSIS — N179 Acute kidney failure, unspecified: Secondary | ICD-10-CM | POA: Diagnosis not present

## 2023-03-25 DIAGNOSIS — W19XXXA Unspecified fall, initial encounter: Secondary | ICD-10-CM | POA: Diagnosis not present

## 2023-03-25 DIAGNOSIS — E876 Hypokalemia: Secondary | ICD-10-CM | POA: Diagnosis not present

## 2023-03-25 DIAGNOSIS — R42 Dizziness and giddiness: Secondary | ICD-10-CM | POA: Diagnosis not present

## 2023-03-26 DIAGNOSIS — A419 Sepsis, unspecified organism: Secondary | ICD-10-CM | POA: Diagnosis not present

## 2023-03-26 DIAGNOSIS — J189 Pneumonia, unspecified organism: Secondary | ICD-10-CM | POA: Diagnosis not present

## 2023-03-26 DIAGNOSIS — J9601 Acute respiratory failure with hypoxia: Secondary | ICD-10-CM | POA: Diagnosis not present

## 2023-03-26 DIAGNOSIS — R55 Syncope and collapse: Secondary | ICD-10-CM | POA: Diagnosis not present

## 2023-03-27 DIAGNOSIS — J9601 Acute respiratory failure with hypoxia: Secondary | ICD-10-CM | POA: Diagnosis not present

## 2023-03-27 DIAGNOSIS — A419 Sepsis, unspecified organism: Secondary | ICD-10-CM | POA: Diagnosis not present

## 2023-03-27 DIAGNOSIS — J189 Pneumonia, unspecified organism: Secondary | ICD-10-CM | POA: Diagnosis not present

## 2023-03-28 ENCOUNTER — Telehealth: Payer: Self-pay | Admitting: *Deleted

## 2023-03-28 ENCOUNTER — Ambulatory Visit: Payer: Medicare PPO

## 2023-03-28 DIAGNOSIS — R002 Palpitations: Secondary | ICD-10-CM

## 2023-03-28 DIAGNOSIS — I440 Atrioventricular block, first degree: Secondary | ICD-10-CM | POA: Diagnosis not present

## 2023-03-28 DIAGNOSIS — I493 Ventricular premature depolarization: Secondary | ICD-10-CM

## 2023-03-28 DIAGNOSIS — A419 Sepsis, unspecified organism: Secondary | ICD-10-CM | POA: Diagnosis not present

## 2023-03-28 DIAGNOSIS — J189 Pneumonia, unspecified organism: Secondary | ICD-10-CM | POA: Diagnosis not present

## 2023-03-28 DIAGNOSIS — J9601 Acute respiratory failure with hypoxia: Secondary | ICD-10-CM | POA: Diagnosis not present

## 2023-03-28 DIAGNOSIS — I499 Cardiac arrhythmia, unspecified: Secondary | ICD-10-CM

## 2023-03-28 NOTE — Telephone Encounter (Signed)
 Pt will come from hospital to have 14 day zio applied per Dr. Vincent Gros for Palpitations, Arrhythmia and PVC's

## 2023-04-01 DIAGNOSIS — N390 Urinary tract infection, site not specified: Secondary | ICD-10-CM | POA: Diagnosis not present

## 2023-04-01 DIAGNOSIS — I493 Ventricular premature depolarization: Secondary | ICD-10-CM | POA: Diagnosis not present

## 2023-04-01 DIAGNOSIS — J439 Emphysema, unspecified: Secondary | ICD-10-CM | POA: Diagnosis not present

## 2023-04-01 DIAGNOSIS — B952 Enterococcus as the cause of diseases classified elsewhere: Secondary | ICD-10-CM | POA: Diagnosis not present

## 2023-04-01 DIAGNOSIS — Z6826 Body mass index (BMI) 26.0-26.9, adult: Secondary | ICD-10-CM | POA: Diagnosis not present

## 2023-04-15 DIAGNOSIS — S72002A Fracture of unspecified part of neck of left femur, initial encounter for closed fracture: Secondary | ICD-10-CM | POA: Diagnosis not present

## 2023-04-15 DIAGNOSIS — T148XXA Other injury of unspecified body region, initial encounter: Secondary | ICD-10-CM | POA: Diagnosis not present

## 2023-04-15 DIAGNOSIS — Z96642 Presence of left artificial hip joint: Secondary | ICD-10-CM | POA: Diagnosis not present

## 2023-04-22 DIAGNOSIS — Z720 Tobacco use: Secondary | ICD-10-CM | POA: Diagnosis not present

## 2023-04-22 DIAGNOSIS — J441 Chronic obstructive pulmonary disease with (acute) exacerbation: Secondary | ICD-10-CM | POA: Diagnosis not present

## 2023-05-23 DIAGNOSIS — J441 Chronic obstructive pulmonary disease with (acute) exacerbation: Secondary | ICD-10-CM | POA: Diagnosis not present

## 2023-05-23 DIAGNOSIS — Z720 Tobacco use: Secondary | ICD-10-CM | POA: Diagnosis not present

## 2023-06-20 DIAGNOSIS — J441 Chronic obstructive pulmonary disease with (acute) exacerbation: Secondary | ICD-10-CM | POA: Diagnosis not present

## 2023-06-20 DIAGNOSIS — Z720 Tobacco use: Secondary | ICD-10-CM | POA: Diagnosis not present

## 2023-07-21 DIAGNOSIS — J441 Chronic obstructive pulmonary disease with (acute) exacerbation: Secondary | ICD-10-CM | POA: Diagnosis not present

## 2023-07-21 DIAGNOSIS — Z720 Tobacco use: Secondary | ICD-10-CM | POA: Diagnosis not present

## 2023-08-05 ENCOUNTER — Other Ambulatory Visit (HOSPITAL_COMMUNITY): Payer: Medicare PPO

## 2023-08-05 ENCOUNTER — Ambulatory Visit: Payer: Medicare PPO | Admitting: Vascular Surgery

## 2023-08-15 DIAGNOSIS — G4700 Insomnia, unspecified: Secondary | ICD-10-CM | POA: Diagnosis not present

## 2023-08-15 DIAGNOSIS — R361 Hematospermia: Secondary | ICD-10-CM | POA: Diagnosis not present

## 2023-08-15 DIAGNOSIS — N179 Acute kidney failure, unspecified: Secondary | ICD-10-CM | POA: Diagnosis not present

## 2023-08-15 DIAGNOSIS — Z125 Encounter for screening for malignant neoplasm of prostate: Secondary | ICD-10-CM | POA: Diagnosis not present

## 2023-08-15 DIAGNOSIS — Z6826 Body mass index (BMI) 26.0-26.9, adult: Secondary | ICD-10-CM | POA: Diagnosis not present

## 2023-08-15 DIAGNOSIS — N529 Male erectile dysfunction, unspecified: Secondary | ICD-10-CM | POA: Diagnosis not present

## 2023-08-20 DIAGNOSIS — J441 Chronic obstructive pulmonary disease with (acute) exacerbation: Secondary | ICD-10-CM | POA: Diagnosis not present

## 2023-08-20 DIAGNOSIS — Z720 Tobacco use: Secondary | ICD-10-CM | POA: Diagnosis not present

## 2023-09-01 IMAGING — CT CT ANGIO CHEST
3 of 6 series · 17 of 46 positions shown · IV contrast (OMNIPAQUE)
Comparison: 12/17/2019; 11/20/2019; 08/19/2017; 06/05/2016;
PET-CT-06/26/2016

CLINICAL DATA: Follow-up thoracic aneurysm.  History of smoking.

EXAM:
CT ANGIOGRAPHY CHEST WITH CONTRAST
TECHNIQUE: Multidetector CT imaging of the chest was performed using the
standard protocol during bolus administration of intravenous
contrast. Multiplanar CT image reconstructions and MIPs were
obtained to evaluate the vascular anatomy.
CONTRAST:  80mL OMNIPAQUE IOHEXOL 350 MG/ML SOLN

[Series 4: axial arterial · axial · arterial · 0.90mm/px · z∈[-104,+217]mm · 12 of 127 slices shown]
[im 10/127  lung]
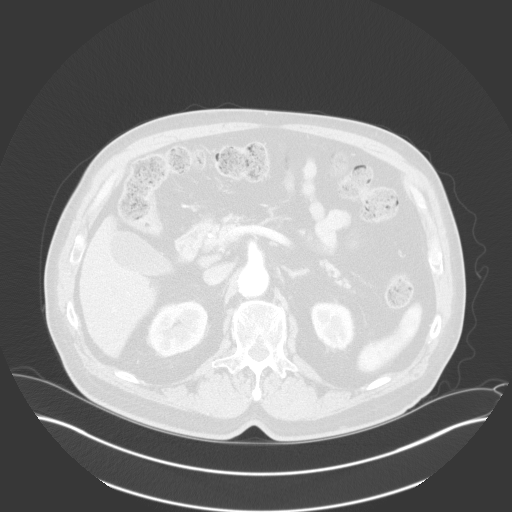
[im 20/127  soft-tissue]
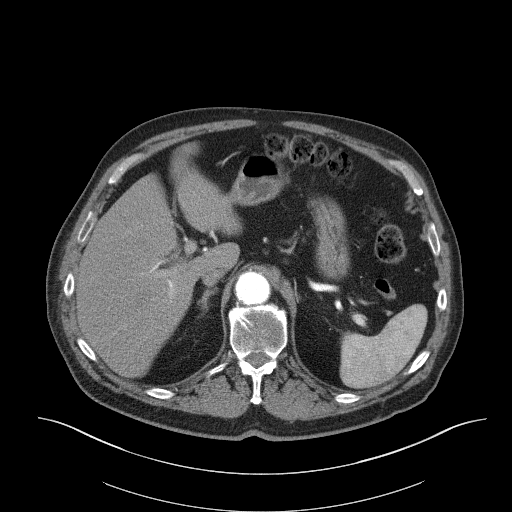
[im 30/127  lung]
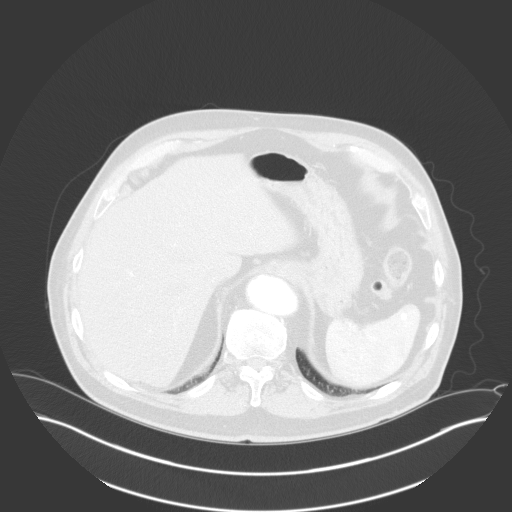
[im 39/127  soft-tissue]
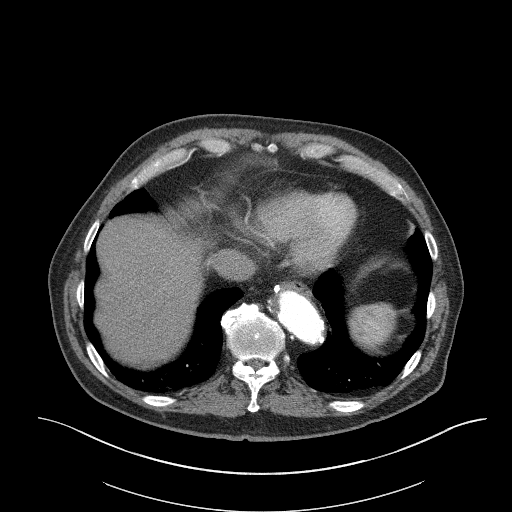
[im 49/127  lung]
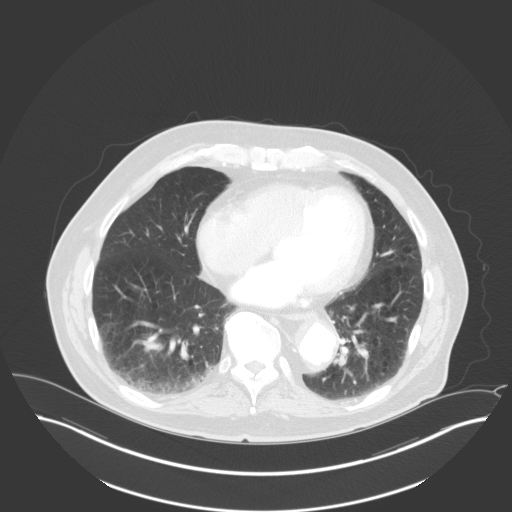
[im 59/127  soft-tissue]
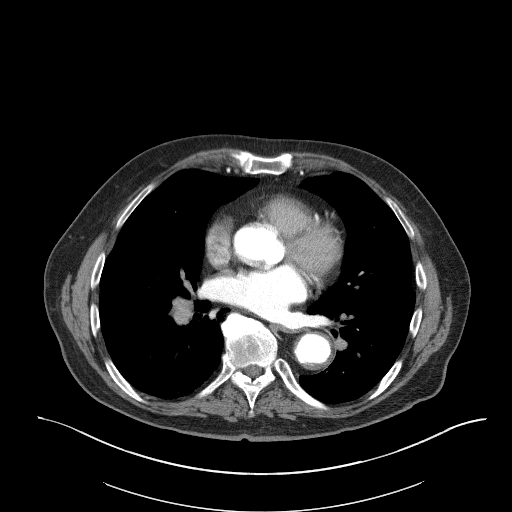
[im 68/127  lung]
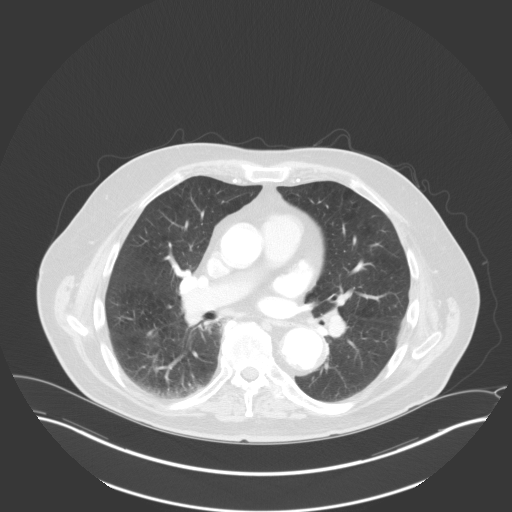
[im 78/127  soft-tissue]
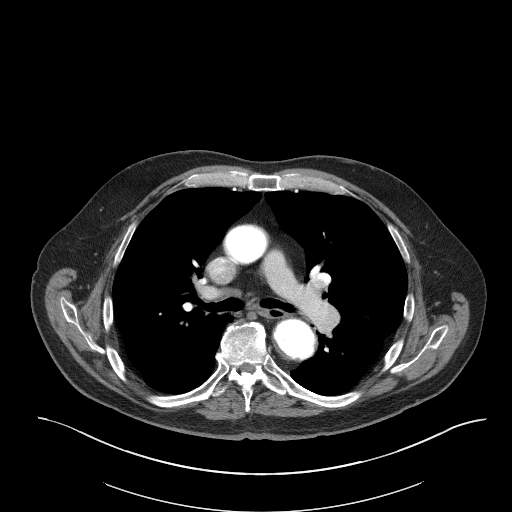
[im 88/127  lung]
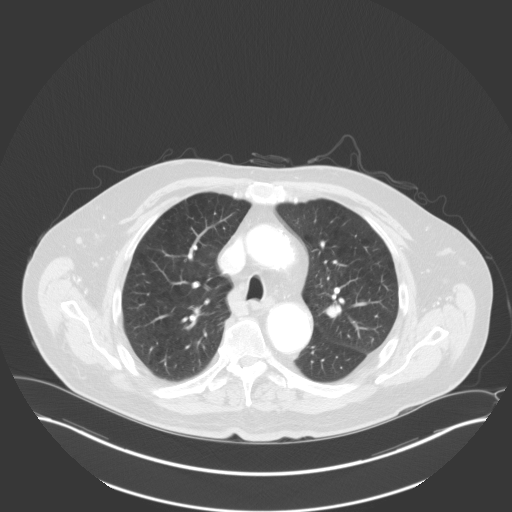
[im 97/127  soft-tissue]
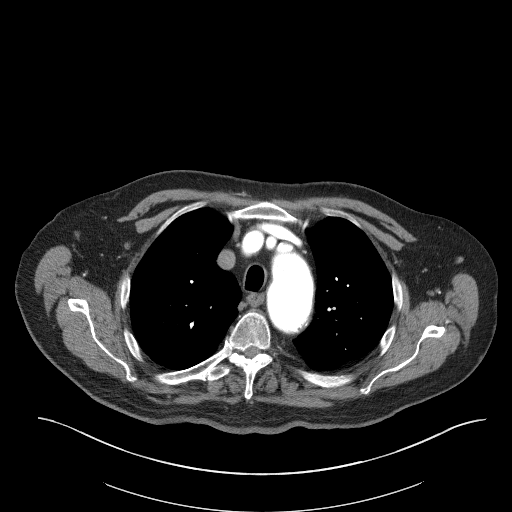
[im 107/127  lung]
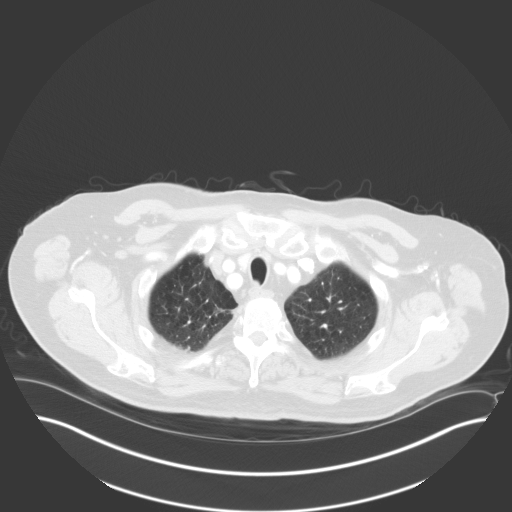
[im 117/127  soft-tissue]
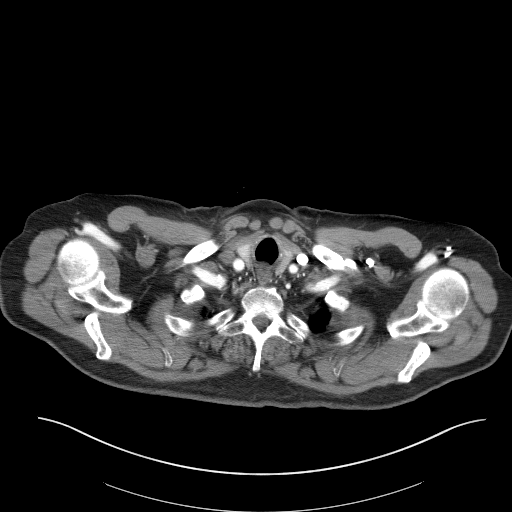

[Series 6: lung · axial · 0.90mm/px · z∈[-67,-27]mm · 2 of 167 slices shown]
[im 20/167  soft-tissue]
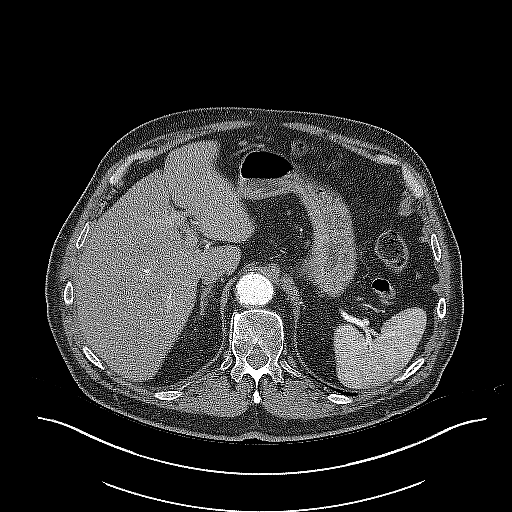
[im 40/167  soft-tissue]
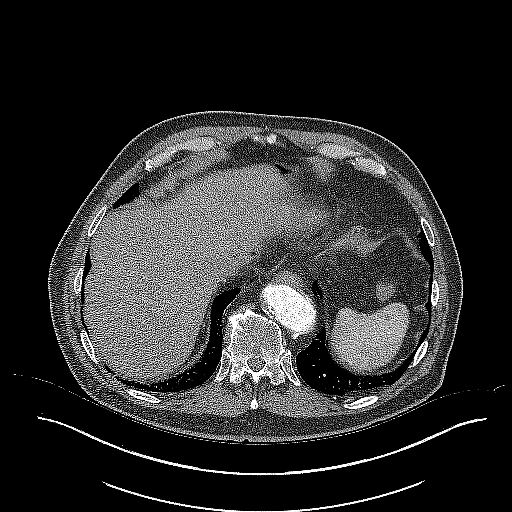

[Series 7: coronal · coronal · 0.80mm/px · 3 of 90 slices shown]
[im 23/90  soft-tissue]
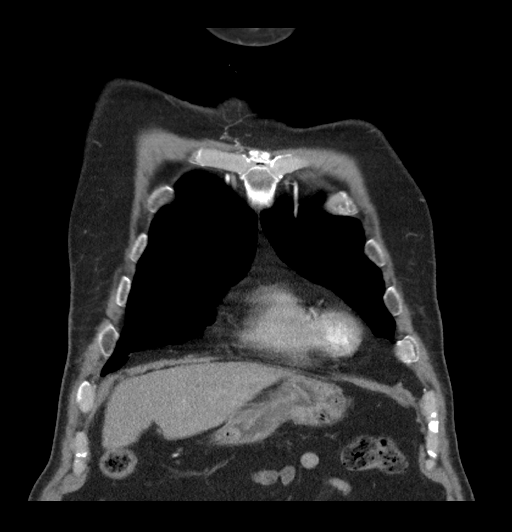
[im 45/90  soft-tissue]
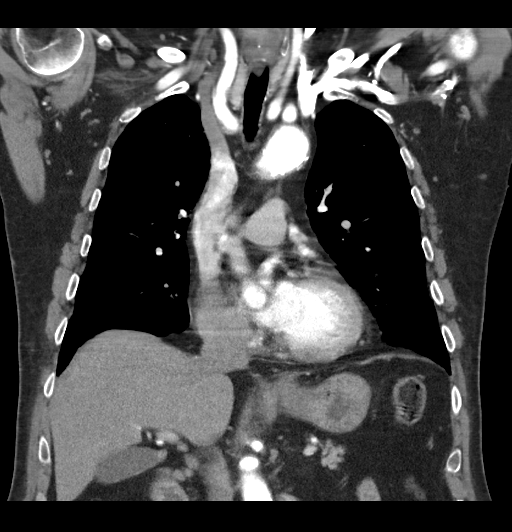
[im 67/90  soft-tissue]
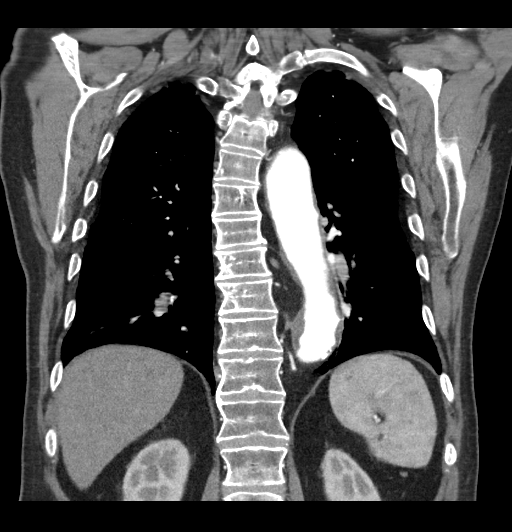

[17 of 46 positions shown; findings below may reference images not displayed]

FINDINGS: Vascular Findings:

Stable ectasia of the ascending thoracic aorta and fusiform
aneurysmal dilatation of the descending thoracic aorta with
measurements as follows. Redemonstrated moderate to large amount of
irregular predominantly noncalcified atherosclerotic plaque
throughout the descending thoracic aorta with several tiny contained
penetrating atherosclerotic ulcers (representative axial images 62
and 75 series 4), similar to the 8043 examination. No evidence of
thoracic aortic dissection or periaortic stranding.

Conventional configuration of the aortic arch. The branch vessels of
the aortic arch appear patent throughout their imaged courses.

Normal heart size.  No pericardial effusion.

Although this examination was not tailored for the evaluation the
pulmonary arteries, there are no discrete filling defects within the
central pulmonary arterial tree to suggest central pulmonary
embolism. Normal caliber of the main pulmonary artery.

-------------------------------------------------------------

Thoracic aortic measurements:

SINOTUBULAR JUNCTION: 37 mm as measured in greatest oblique short
axis coronal dimension.

PROXIMAL ASCENDING THORACIC AORTA: 37 mm as measured in greatest
oblique short axis axial dimension at the level of the main
pulmonary artery (axial image 56, series 4) and approximately 37 mm
in greatest oblique short axis coronal diameter (coronal image 39,
series 7)

AORTIC ARCH: 32 mm as measured in greatest oblique short axis
sagittal dimension.

PROXIMAL DESCENDING THORACIC AORTA: The proximal aspect of the
descending thoracic aorta measures approximately 45 mm in greatest
oblique short axis sagittal diameter (sagittal image 69, series 8).
The descending thoracic aorta measures approximately 38 mm at the
level of the main pulmonary artery (image 56, series 4 and
approximately 40 mm distally (sagittal image 78, series 8),
unchanged compared to the [DATE] examination by my direct
remeasurement. The thoracic aorta tapers to a normal caliber at the
level of the diaphragmatic hiatus.

DISTAL DESCENDING THORACIC AORTA: 36 mm as measured in greatest
oblique short axis axial dimension at the level of the diaphragmatic
hiatus.

Review of the MIP images confirms the above findings.

-------------------------------------------------------------

Non-Vascular Findings:

Mediastinum/Lymph Nodes: Partially calcified right suprahilar lymph
nodes, the sequela of previous granulomatous infection, unchanged.
No bulky mediastinal, hilar or axillary lymphadenopathy.

Lungs/Pleura: Redemonstrated advanced apical predominant mixed
centrilobular and paraseptal emphysematous change. Minimal dependent
subpleural ground-glass atelectasis, right greater than left.

No new pulmonary nodules.

Partially calcified granuloma within the left lower lobe measuring
1.1 cm in diameter (image 116, series 6) as well as a densely
calcified 5 mm right lower lobe pulmonary granuloma (image 105,
series 6) are unchanged compared to the 8043 examination.
Noncalcified 4 mm nodule versus perifissural lymph node about the
right hilum (image 81, series 6), is unchanged compared to the 8043
examination.

Upper abdomen: Limited early arterial phase evaluation of the upper
abdomen demonstrates multiple punctate granuloma within the spleen.

Musculoskeletal: No acute or aggressive regional soft tissues appear
normal.
IMPRESSION: 1. Stable uncomplicated ectasia of the ascending thoracic aorta
(measuring 37 mm) and fusiform aneurysmal dilatation the descending
thoracic aorta (measuring 45 mm) in greatest diameter, unchanged
compared to the [DATE] examination. Aortic aneurysm NOS
(529XM-UHQ.T).
2. Aortic Atherosclerosis (529XM-8JD.D) and Emphysema (529XM-FQV.0).

## 2023-09-15 NOTE — Progress Notes (Unsigned)
 Patient name: Brian Ibarra MRN: [id number] DOB: [birth year] Sex: male  REASON FOR CONSULT: 6 month follow-up 5.2 cm AAA  HPI: Brian Ibarra is a 78 y.o. male, with history of COPD, hypertension, tobacco abuse that presents 6 month follow-up of 5.2 cm AAA.   He has no new complaints today.  He is still smoking about a pack and half a day. 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  No chest pain.  No abdominal or back pain.    We are also following a 4.5 cm descending thoracic aneurysm last evaluated on 01/28/2023.      Past Medical History:  Diagnosis Date   AAA (abdominal aortic aneurysm) (HCC)    being monitored by vascular MD, currently 4.5cm   AAA (abdominal aortic aneurysm) without rupture (HCC) 01/16/2021   Aneurysm of infrarenal abdominal aorta (HCC)    Aortic atherosclerosis (HCC)    Benign essential hypertension    BMI 28.0-28.9,adult    COPD (chronic obstructive pulmonary disease) (HCC)    COPD with emphysema (HCC)    Descending thoracic aortic aneurysm (HCC)    4.3cm, monitored yearly by vascular   ED (erectile dysfunction)    Frequent PVCs 5.9% burden by event monitor 11-26-2022; runs of nonsustained ventricular tachycardia up to 18 beats lasting 8.8 seconds 12/24/2022   Heavy smoker    smokes 1-11/2 ppd   Hypertension    Intermittent palpitations    Mixed hyperlipidemia    Overgrown toenails 11/25/2018   Peripheral vascular disease of lower extremity (HCC)    Precordial pain 12/24/2022   PSH: None  Family History  Problem Relation Age of Onset   Heart disease Mother    Cancer Mother    Cancer Father     SOCIAL HISTORY: Social History   Socioeconomic History   Marital status: Divorced    Spouse name: Not on file   Number of children: Not on file   Years of education: Not on file   Highest education level: Not on file  Occupational History   Not on file  Tobacco Use   Smoking status: Every Day    Current packs/day: 1.50    Types:  Cigarettes   Smokeless tobacco: Former  Substance and Sexual Activity   Alcohol use: Yes    Comment: drinks 1 burbon nightly   Drug use: Never   Sexual activity: Yes  Other Topics Concern   Not on file  Social History Narrative   Not on file   Social Drivers of Health   Financial Resource Strain: Not on file  Food Insecurity: Not on file  Transportation Needs: Not on file  Physical Activity: Not on file  Stress: Not on file  Social Connections: Not on file  Intimate Partner Violence: Not on file    No Known Allergies  Current Outpatient Medications  Medication Sig Dispense Refill   albuterol (VENTOLIN HFA) 108 (90 Base) MCG/ACT inhaler Inhale 2 puffs into the lungs daily.     amLODipine-olmesartan (AZOR) 10-40 MG tablet Take 1 tablet by mouth daily.     aspirin 81 MG EC tablet Take 81 mg by mouth daily. (Patient not taking: Reported on 01/30/2023)     atorvastatin (LIPITOR) 40 MG tablet Take 40 mg by mouth daily.     clopidogrel (PLAVIX) 75 MG tablet Take 75 mg by mouth daily.     doxazosin (CARDURA) 1 MG tablet Take 1 mg by mouth at bedtime.     fluticasone furoate-vilanterol (BREO ELLIPTA)  100-25 MCG/ACT AEPB Inhale 2 puffs into the lungs daily. (Patient not taking: Reported on 01/30/2023)     hydrochlorothiazide  (HYDRODIURIL ) 25 MG tablet Take 1 tablet (25 mg total) by mouth daily. 90 tablet 3   metoprolol  tartrate (LOPRESSOR ) 25 MG tablet Take 1 tablet (25 mg total) by mouth 2 (two) times daily. 180 tablet 3   STIOLTO RESPIMAT 2.5-2.5 MCG/ACT AERS Inhale 2 puffs into the lungs daily.     No current facility-administered medications for this visit.    REVIEW OF SYSTEMS:  [X]  denotes positive finding, [ ]  denotes negative finding Cardiac  Comments:  Chest pain or chest pressure:    Shortness of breath upon exertion:    Short of breath when lying flat:    Irregular heart rhythm:        Vascular    Pain in calf, thigh, or hip brought on by ambulation:    Pain in feet  at night that wakes you up from your sleep:     Blood clot in your veins:    Leg swelling:         Pulmonary    Oxygen  at home:    Productive cough:     Wheezing:         Neurologic    Sudden weakness in arms or legs:     Sudden numbness in arms or legs:     Sudden onset of difficulty speaking or slurred speech:    Temporary loss of vision in one eye:     Problems with dizziness:         Gastrointestinal    Blood in stool:     Vomited blood:         Genitourinary    Burning when urinating:     Blood in urine:        Psychiatric    Major depression:         Hematologic    Bleeding problems:    Problems with blood clotting too easily:        Skin    Rashes or ulcers:        Constitutional    Fever or chills:      PHYSICAL EXAM: There were no vitals filed for this visit.   GENERAL: The patient is a well-nourished male, in no acute distress. The vital signs are documented above. CARDIAC: There is a regular rate and rhythm.  VASCULAR:  Palpable femoral pulses bilateral groins Right DP palpable Difficult to palpate left pedal pulse PULMONARY: No respiratory distress. ABDOMEN: Soft and non-tender.  No pain with deep palpation. MUSCULOSKELETAL: There are no major deformities or cyanosis. NEUROLOGIC: No focal weakness or paresthesias are detected. SKIN: There are no ulcers or rashes noted. PSYCHIATRIC: The patient has a normal affect.  DATA:   AAA duplex today shows 5.2 --> 5.4 cm over past 6 months  CT chest w/o contrast 01/28/23 shows stable 4.5 cm descending thoracic aneurysm (compared to 01/14/2022)  Assessment/Plan:  78 year old male presents for 6 month follow-up of a 5.2 cm AAA.  His duplex today shows increase in his AAA to 5.4 cm  Discussed that in men we repair these at 5.5 cm.  He essentially meets criteria for repair as we discussed today.  Also his aneurysm has shown significant growth over the last year from 4.7 cm to 5.2 cm to now 5.4 cm.  Discussed  I will get a CTA chest abdomen pelvis to evaluate his anatomy and see if he is  a stent graft candidate for AAA repair.  We will get that ordered urgently and then have him follow-up with me in the office.  Will include the chest given he has this descending thoracic aneurysm that will need updated imaging on as well.   Lonni DOROTHA Gaskins, MD [facility] [place] Office: [phone]

## 2023-09-16 ENCOUNTER — Ambulatory Visit (HOSPITAL_COMMUNITY)
Admission: RE | Admit: 2023-09-16 | Discharge: 2023-09-16 | Disposition: A | Discharge status: Home / Self Care | Source: Ambulatory Visit | Attending: Vascular Surgery | Admitting: Vascular Surgery

## 2023-09-16 ENCOUNTER — Ambulatory Visit (HOSPITAL_COMMUNITY): Admitting: Vascular Surgery

## 2023-09-16 ENCOUNTER — Encounter: Payer: Self-pay | Admitting: Vascular Surgery

## 2023-09-16 VITALS — BP 130/65 | HR 60 | Temp 98.0°F | Resp 22 | Ht 74.0 in | Wt 195.5 lb

## 2023-09-16 DIAGNOSIS — I7143 Infrarenal abdominal aortic aneurysm, without rupture: Secondary | ICD-10-CM

## 2023-09-16 DIAGNOSIS — I7123 Aneurysm of the descending thoracic aorta, without rupture: Secondary | ICD-10-CM | POA: Insufficient documentation

## 2023-09-17 ENCOUNTER — Other Ambulatory Visit: Payer: Self-pay

## 2023-09-17 DIAGNOSIS — I7143 Infrarenal abdominal aortic aneurysm, without rupture: Secondary | ICD-10-CM

## 2023-09-20 DIAGNOSIS — Z720 Tobacco use: Secondary | ICD-10-CM | POA: Diagnosis not present

## 2023-09-20 DIAGNOSIS — J441 Chronic obstructive pulmonary disease with (acute) exacerbation: Secondary | ICD-10-CM | POA: Diagnosis not present

## 2023-10-10 ENCOUNTER — Inpatient Hospital Stay: Admission: RE | Admit: 2023-10-10 | Source: Ambulatory Visit

## 2023-10-13 ENCOUNTER — Ambulatory Visit
Admission: RE | Admit: 2023-10-13 | Discharge: 2023-10-13 | Disposition: A | Discharge status: Home / Self Care | Source: Ambulatory Visit | Attending: Vascular Surgery | Admitting: Vascular Surgery

## 2023-10-13 DIAGNOSIS — J439 Emphysema, unspecified: Secondary | ICD-10-CM | POA: Diagnosis not present

## 2023-10-13 DIAGNOSIS — I7143 Infrarenal abdominal aortic aneurysm, without rupture: Secondary | ICD-10-CM | POA: Diagnosis not present

## 2023-10-13 DIAGNOSIS — R918 Other nonspecific abnormal finding of lung field: Secondary | ICD-10-CM | POA: Diagnosis not present

## 2023-10-13 DIAGNOSIS — N400 Enlarged prostate without lower urinary tract symptoms: Secondary | ICD-10-CM | POA: Diagnosis not present

## 2023-10-13 DIAGNOSIS — I7123 Aneurysm of the descending thoracic aorta, without rupture: Secondary | ICD-10-CM | POA: Diagnosis not present

## 2023-10-13 DIAGNOSIS — K5730 Diverticulosis of large intestine without perforation or abscess without bleeding: Secondary | ICD-10-CM | POA: Diagnosis not present

## 2023-10-13 MED ORDER — IOPAMIDOL (ISOVUE-370) INJECTION 76%
100.0000 mL | Freq: Once | INTRAVENOUS | Status: AC | PRN
  Administered 2023-10-13: 100 mL via INTRAVENOUS

## 2023-10-20 DIAGNOSIS — J441 Chronic obstructive pulmonary disease with (acute) exacerbation: Secondary | ICD-10-CM | POA: Diagnosis not present

## 2023-10-20 DIAGNOSIS — Z720 Tobacco use: Secondary | ICD-10-CM | POA: Diagnosis not present

## 2023-10-21 ENCOUNTER — Ambulatory Visit: Attending: Vascular Surgery | Admitting: Vascular Surgery

## 2023-10-21 ENCOUNTER — Encounter: Payer: Self-pay | Admitting: Vascular Surgery

## 2023-10-21 VITALS — BP 122/66 | HR 55 | Temp 98.3°F | Resp 18 | Ht 74.0 in | Wt 200.7 lb

## 2023-10-21 DIAGNOSIS — I7143 Infrarenal abdominal aortic aneurysm, without rupture: Secondary | ICD-10-CM | POA: Diagnosis not present

## 2023-10-21 NOTE — Progress Notes (Signed)
 Patient name: Brian Ibarra MRN: [id number] DOB: Mar 10, 1946 Sex: male  REASON FOR CONSULT: 1 month follow-up AAA after CTA  HPI: Brian Ibarra is a 78 y.o. male, with history of COPD, hypertension, tobacco abuse that presents for follow-up after CTA for AAA.   Seen 09/16/2023 with a 5.4 cm AAA and sent for CT.  We are also following a 4.5 cm descending thoracic aneurysm last evaluated on 01/28/2023.    He presents for follow-up to discuss repair of his AAA.  No other concerns today other than some pain down his left hip into the foot that bothers him for several minutes in the morning    Past Medical History:  Diagnosis Date   AAA (abdominal aortic aneurysm) (HCC)    being monitored by vascular MD, currently 4.5cm   AAA (abdominal aortic aneurysm) without rupture (HCC) 01/16/2021   Aneurysm of infrarenal abdominal aorta (HCC)    Aortic atherosclerosis (HCC)    Benign essential hypertension    BMI 28.0-28.9,adult    COPD (chronic obstructive pulmonary disease) (HCC)    COPD with emphysema (HCC)    Descending thoracic aortic aneurysm (HCC)    4.3cm, monitored yearly by vascular   ED (erectile dysfunction)    Frequent PVCs 5.9% burden by event monitor 11-26-2022; runs of nonsustained ventricular tachycardia up to 18 beats lasting 8.8 seconds 12/24/2022   Heavy smoker    smokes 1-11/2 ppd   Hypertension    Intermittent palpitations    Mixed hyperlipidemia    Overgrown toenails 11/25/2018   Peripheral vascular disease of lower extremity (HCC)    Precordial pain 12/24/2022   PSH: None  Family History  Problem Relation Age of Onset   Heart disease Mother    Cancer Mother    Cancer Father     SOCIAL HISTORY: Social History   Socioeconomic History   Marital status: Divorced    Spouse name: Not on file   Number of children: Not on file   Years of education: Not on file   Highest education level: Not on file  Occupational History   Not on file  Tobacco Use   Smoking  status: Every Day    Current packs/day: 1.50    Types: Cigarettes   Smokeless tobacco: Former  Building services engineer status: Never Used  Substance and Sexual Activity   Alcohol use: Yes    Comment: drinks 1 burbon nightly   Drug use: Never   Sexual activity: Yes  Other Topics Concern   Not on file  Social History Narrative   Not on file   Social Drivers of Health   Financial Resource Strain: Not on file  Food Insecurity: Not on file  Transportation Needs: Not on file  Physical Activity: Not on file  Stress: Not on file  Social Connections: Not on file  Intimate Partner Violence: Not on file    No Known Allergies  Current Outpatient Medications  Medication Sig Dispense Refill   albuterol (VENTOLIN HFA) 108 (90 Base) MCG/ACT inhaler Inhale 2 puffs into the lungs daily.     amLODipine-olmesartan (AZOR) 10-40 MG tablet Take 1 tablet by mouth daily.     aspirin 81 MG EC tablet Take 81 mg by mouth daily.     atorvastatin (LIPITOR) 40 MG tablet Take 40 mg by mouth daily.     clopidogrel (PLAVIX) 75 MG tablet Take 75 mg by mouth daily.     doxazosin (CARDURA) 1 MG tablet Take 1 mg  by mouth at bedtime.     fluticasone furoate-vilanterol (BREO ELLIPTA) 100-25 MCG/ACT AEPB Inhale 2 puffs into the lungs daily.     hydrochlorothiazide  (HYDRODIURIL ) 25 MG tablet Take 1 tablet (25 mg total) by mouth daily. 90 tablet 3   metoprolol  tartrate (LOPRESSOR ) 25 MG tablet Take 1 tablet (25 mg total) by mouth 2 (two) times daily. 180 tablet 3   STIOLTO RESPIMAT 2.5-2.5 MCG/ACT AERS Inhale 2 puffs into the lungs daily.     traZODone (DESYREL) 50 MG tablet Take 100 mg by mouth at bedtime as needed.     No current facility-administered medications for this visit.    REVIEW OF SYSTEMS:  [X]  denotes positive finding, [ ]  denotes negative finding Cardiac  Comments:  Chest pain or chest pressure:    Shortness of breath upon exertion:    Short of breath when lying flat:    Irregular heart rhythm:         Vascular    Pain in calf, thigh, or hip brought on by ambulation:    Pain in feet at night that wakes you up from your sleep:     Blood clot in your veins:    Leg swelling:         Pulmonary    Oxygen  at home:    Productive cough:     Wheezing:         Neurologic    Sudden weakness in arms or legs:     Sudden numbness in arms or legs:     Sudden onset of difficulty speaking or slurred speech:    Temporary loss of vision in one eye:     Problems with dizziness:         Gastrointestinal    Blood in stool:     Vomited blood:         Genitourinary    Burning when urinating:     Blood in urine:        Psychiatric    Major depression:         Hematologic    Bleeding problems:    Problems with blood clotting too easily:        Skin    Rashes or ulcers:        Constitutional    Fever or chills:      PHYSICAL EXAM: There were no vitals filed for this visit.   GENERAL: The patient is a well-nourished male, in no acute distress. The vital signs are documented above. CARDIAC: There is a regular rate and rhythm.  VASCULAR:  Palpable femoral pulses bilateral groins Right DP palpable Difficult to palpate left pedal pulse PULMONARY: No respiratory distress. ABDOMEN: Soft and non-tender.  No pain with deep palpation. MUSCULOSKELETAL: There are no major deformities or cyanosis. NEUROLOGIC: No focal weakness or paresthesias are detected. SKIN: There are no ulcers or rashes noted. PSYCHIATRIC: The patient has a normal affect.  DATA:   CTA reviewed from 10/13/2023 with essentially 5.5 cm AAA as well as a 4.2 cm proximal descending and 3.8 cm distal descending thoracic aneurysm  AAA duplex previously showed 5.2 --> 5.4 cm over past 6 months  CT chest w/o contrast 01/28/23 shows stable 4.5 cm descending thoracic aneurysm (compared to 01/14/2022)  Assessment/Plan:   78 y.o. male, with history of COPD, hypertension, tobacco abuse that presents for follow-up after CTA  for AAA.   Seen 09/16/2023 with a 5.4 cm AAA by duplex and sent for CT. Discussed in the past  year his aneurysm has increased from 4.7 cm to now essentially 5.5 cm.  I have recommended stent graft repair.  I discussed in men we typically repair these at 5.5 cm and also with continued growth I think it is time to proceed.  I discussed bilateral transfemoral access with aortobiiliac stent graft repair of abdominal aortic aneurysm.  Will get scheduled at [facility] in the OR.  Risk benefits discussed including risk of bleeding, infection, groin problems including bleeding and hematoma, risk of stroke MI anesthesia etc. including renal insufficiency.  All questions answered.   Lonni DOROTHA Gaskins, MD [facility] Office: [phone]

## 2023-10-22 ENCOUNTER — Other Ambulatory Visit: Payer: Self-pay

## 2023-10-22 DIAGNOSIS — I7123 Aneurysm of the descending thoracic aorta, without rupture: Secondary | ICD-10-CM

## 2023-10-29 DIAGNOSIS — N529 Male erectile dysfunction, unspecified: Secondary | ICD-10-CM | POA: Diagnosis not present

## 2023-10-29 DIAGNOSIS — Z6826 Body mass index (BMI) 26.0-26.9, adult: Secondary | ICD-10-CM | POA: Diagnosis not present

## 2023-10-29 DIAGNOSIS — J441 Chronic obstructive pulmonary disease with (acute) exacerbation: Secondary | ICD-10-CM | POA: Diagnosis not present

## 2023-10-29 DIAGNOSIS — I7143 Infrarenal abdominal aortic aneurysm, without rupture: Secondary | ICD-10-CM | POA: Diagnosis not present

## 2023-11-11 NOTE — Progress Notes (Signed)
 Surgical Instructions   Your procedure is scheduled on Monday, August 25th, 2025. Report to [facility] Main Entrance A at 7:30 A.M., then check in with the Admitting office. Any questions or running late day of surgery: call [phone]  Questions prior to your surgery date: call [phone], Monday-Friday, 8am-4pm. If you experience any cold or flu symptoms such as cough, fever, chills, shortness of breath, etc. between now and your scheduled surgery, please notify us  at the above number.     Remember:  Do not eat or drink after midnight the night before your surgery    Take these medicines the morning of surgery with A SIP OF WATER: Aspirin Atorvastatin (Lipitor) Fluticasone Furoate-Vilanterol (Breo Ellipta) Metoprolol  Tartrate (Lopressor ) Stiolto Respimate   May take these medicines IF NEEDED: Albuterol (Venotolin HFA) inhaler - bring with you on the day of surgery   Per your surgeon's instructions, your last dose of Clopidogrel (Plavix) should be on Monday, August 18th.   One week prior to surgery, STOP taking any Aleve, Naproxen, Ibuprofen, Motrin, Advil, Goody's, BC's, all herbal medications, fish oil, and non-prescription vitamins.                     Do NOT Smoke (Tobacco/Vaping) for 24 hours prior to your procedure.  If you use a CPAP at night, you may bring your mask/headgear for your overnight stay.   You will be asked to remove any contacts, glasses, piercing's, hearing aid's, dentures/partials prior to surgery. Please bring cases for these items if needed.    Patients discharged the day of surgery will not be allowed to drive home, and someone needs to stay with them for 24 hours.  SURGICAL WAITING ROOM VISITATION Patients may have no more than 2 support people in the waiting area - these visitors may rotate.   Pre-op nurse will coordinate an appropriate time for 1 ADULT support person, who may not rotate, to accompany patient in pre-op.  Children under the  age of 16 must have an adult with them who is not the patient and must remain in the main waiting area with an adult.  If the patient needs to stay at the hospital during part of their recovery, the visitor guidelines for inpatient rooms apply.  Please refer to the [facility] website for the visitor guidelines for any additional information.   If you received a COVID test during your pre-op visit  it is requested that you wear a mask when out in public, stay away from anyone that may not be feeling well and notify your surgeon if you develop symptoms. If you have been in contact with anyone that has tested positive in the last 10 days please notify you surgeon.      Pre-operative CHG Bathing Instructions   You can play a key role in reducing the risk of infection after surgery. Your skin needs to be as free of germs as possible. You can reduce the number of germs on your skin by washing with CHG (chlorhexidine gluconate) soap before surgery. CHG is an antiseptic soap that kills germs and continues to kill germs even after washing.   DO NOT use if you have an allergy to chlorhexidine/CHG or antibacterial soaps. If your skin becomes reddened or irritated, stop using the CHG and notify one of our RNs at [phone].              TAKE A SHOWER THE NIGHT BEFORE SURGERY AND THE DAY OF SURGERY  Please keep in mind the following:  DO NOT shave, including legs and underarms, 48 hours prior to surgery.   You may shave your face before/day of surgery.  Place clean sheets on your bed the night before surgery Use a clean washcloth (not used since being washed) for each shower. DO NOT sleep with pet's night before surgery.  CHG Shower Instructions:  Wash your face and private area with normal soap. If you choose to wash your hair, wash first with your normal shampoo.  After you use shampoo/soap, rinse your hair and body thoroughly to remove shampoo/soap residue.  Turn the water OFF and apply half  the bottle of CHG soap to a CLEAN washcloth.  Apply CHG soap ONLY FROM YOUR NECK DOWN TO YOUR TOES (washing for 3-5 minutes)  DO NOT use CHG soap on face, private areas, open wounds, or sores.  Pay special attention to the area where your surgery is being performed.  If you are having back surgery, having someone wash your back for you may be helpful. Wait 2 minutes after CHG soap is applied, then you may rinse off the CHG soap.  Pat dry with a clean towel  Put on clean pajamas    Additional instructions for the day of surgery: DO NOT APPLY any lotions, deodorants, cologne, or perfumes.   Do not wear jewelry or makeup Do not wear nail polish, gel polish, artificial nails, or any other type of covering on natural nails (fingers and toes) Do not bring valuables to the hospital. [facility] is not responsible for valuables/personal belongings. Put on clean/comfortable clothes.  Please brush your teeth.  Ask your nurse before applying any prescription medications to the skin.

## 2023-11-12 ENCOUNTER — Encounter (HOSPITAL_COMMUNITY): Payer: Self-pay

## 2023-11-12 ENCOUNTER — Other Ambulatory Visit: Payer: Self-pay

## 2023-11-12 ENCOUNTER — Encounter (HOSPITAL_COMMUNITY)
Admission: RE | Admit: 2023-11-12 | Discharge: 2023-11-12 | Disposition: A | Discharge status: Home / Self Care | Source: Ambulatory Visit | Attending: Vascular Surgery | Admitting: Vascular Surgery

## 2023-11-12 VITALS — BP 141/71 | HR 71 | Temp 97.7°F | Resp 17 | Ht 74.0 in | Wt 198.6 lb

## 2023-11-12 DIAGNOSIS — I10 Essential (primary) hypertension: Secondary | ICD-10-CM | POA: Insufficient documentation

## 2023-11-12 DIAGNOSIS — R002 Palpitations: Secondary | ICD-10-CM | POA: Insufficient documentation

## 2023-11-12 DIAGNOSIS — I700 Atherosclerosis of aorta: Secondary | ICD-10-CM | POA: Insufficient documentation

## 2023-11-12 DIAGNOSIS — I493 Ventricular premature depolarization: Secondary | ICD-10-CM | POA: Insufficient documentation

## 2023-11-12 DIAGNOSIS — Z96642 Presence of left artificial hip joint: Secondary | ICD-10-CM | POA: Diagnosis not present

## 2023-11-12 DIAGNOSIS — E785 Hyperlipidemia, unspecified: Secondary | ICD-10-CM | POA: Insufficient documentation

## 2023-11-12 DIAGNOSIS — J439 Emphysema, unspecified: Secondary | ICD-10-CM | POA: Diagnosis not present

## 2023-11-12 DIAGNOSIS — I7123 Aneurysm of the descending thoracic aorta, without rupture: Secondary | ICD-10-CM | POA: Diagnosis not present

## 2023-11-12 DIAGNOSIS — Z01812 Encounter for preprocedural laboratory examination: Secondary | ICD-10-CM | POA: Insufficient documentation

## 2023-11-12 DIAGNOSIS — Z01818 Encounter for other preprocedural examination: Secondary | ICD-10-CM

## 2023-11-12 DIAGNOSIS — E876 Hypokalemia: Secondary | ICD-10-CM

## 2023-11-12 DIAGNOSIS — I7143 Infrarenal abdominal aortic aneurysm, without rupture: Secondary | ICD-10-CM

## 2023-11-12 LAB — TYPE AND SCREEN
ABO/RH(D): O POS
Antibody Screen: NEGATIVE

## 2023-11-12 LAB — COMPREHENSIVE METABOLIC PANEL WITH GFR
ALT: 15 U/L (ref 0–44)
AST: 18 U/L (ref 15–41)
Albumin: 3.3 g/dL — ABNORMAL LOW (ref 3.5–5.0)
Alkaline Phosphatase: 68 U/L (ref 38–126)
Anion gap: 12 (ref 5–15)
BUN: 8 mg/dL (ref 8–23)
CO2: 35 mmol/L — ABNORMAL HIGH (ref 22–32)
Calcium: 9 mg/dL (ref 8.9–10.3)
Chloride: 96 mmol/L — ABNORMAL LOW (ref 98–111)
Creatinine, Ser: 0.94 mg/dL (ref 0.61–1.24)
GFR, Estimated: >60 mL/min (ref >60)
Glucose, Bld: 127 mg/dL — ABNORMAL HIGH (ref 70–99)
Potassium: 2.6 mmol/L — CL (ref 3.5–5.1)
Sodium: 143 mmol/L (ref 135–145)
Total Bilirubin: 1.3 mg/dL — ABNORMAL HIGH (ref 0.0–1.2)
Total Protein: 6.5 g/dL (ref 6.5–8.1)

## 2023-11-12 LAB — URINALYSIS, ROUTINE W REFLEX MICROSCOPIC
Bilirubin Urine: NEGATIVE
Glucose, UA: NEGATIVE mg/dL
Hgb urine dipstick: NEGATIVE
Ketones, ur: NEGATIVE mg/dL
Nitrite: NEGATIVE
Protein, ur: 30 mg/dL — AB
Specific Gravity, Urine: 1.019 (ref 1.005–1.030)
pH: 6 (ref 5.0–8.0)

## 2023-11-12 LAB — CBC
HCT: 39.5 % (ref 39.0–52.0)
Hemoglobin: 13.3 g/dL (ref 13.0–17.0)
MCH: 31.1 pg (ref 26.0–34.0)
MCHC: 33.7 g/dL (ref 30.0–36.0)
MCV: 92.5 fL (ref 80.0–100.0)
Platelets: 222 K/uL (ref 150–400)
RBC: 4.27 MIL/uL (ref 4.22–5.81)
RDW: 13 % (ref 11.5–15.5)
WBC: 6.9 K/uL (ref 4.0–10.5)
nRBC: 0 % (ref 0.0–0.2)

## 2023-11-12 LAB — SURGICAL PCR SCREEN
MRSA, PCR: NEGATIVE
Staphylococcus aureus: NEGATIVE

## 2023-11-12 LAB — APTT: aPTT: 33 s (ref 24–36)

## 2023-11-12 MED ORDER — NITROFURANTOIN MONOHYD MACRO 100 MG PO CAPS: 100.0000 mg | ORAL_CAPSULE | Freq: Two times a day (BID) | ORAL | 0 refills | Status: AC

## 2023-11-12 NOTE — Progress Notes (Signed)
 Critical Low potassium 2.6 form Renea Mau, [facility] lab.  Spoke to Alan Slater, RN at Dr. Gaylene office to make them aware. They will re-draw potassium DOS.  Called his PCP Dr. Tracey Bathe and could only reach a VM for triage. Message left regarding results and to call back with game plan of those results. Lynwood Hope, PA-C with Anesthesia also made aware.

## 2023-11-12 NOTE — Progress Notes (Signed)
 PCP - Dr. Tracey Bathe Cardiologist -  Dr. Alean Madireddy, LOV 01/30/2023  PPM/ICD - denies Device Orders - na Rep Notified - na  Chest x-ray - na EKG - 03/28/2023 Stress Test -  ECHO - 01/29/2023 Cardiac Cath -   Sleep Study - Had a sleep study, no diagnoses of sleep apnea CPAP - na  Non-diabetic  Blood Thinner Instructions: Plavix, last dose 11/10/2023 Aspirin Instructions: Continue  ERAS Protcol - NPO  Anesthesia review: Yes.   Patient denies shortness of breath, fever, cough and chest pain at PAT appointment   All instructions explained to the patient, with a verbal understanding of the material. Patient agrees to go over the instructions while at home for a better understanding. Patient also instructed to self quarantine after being tested for COVID-19. The opportunity to ask questions was provided.

## 2023-11-13 NOTE — Anesthesia Preprocedure Evaluation (Addendum)
 Anesthesia Evaluation  Patient identified by MRN, date of birth, ID band Patient awake    Reviewed: Allergy & Precautions, NPO status , Patient's Chart, lab work & pertinent test results, reviewed documented beta blocker date and time   History of Anesthesia Complications Negative for: history of anesthetic complications  Airway Mallampati: II  TM Distance: >3 FB     Dental  (+) Edentulous Upper   Pulmonary neg shortness of breath, COPD,  COPD inhaler, Current Smoker and Patient abstained from smoking.   breath sounds clear to auscultation       Cardiovascular hypertension, + CAD  (-) Past MI, (-) Cardiac Stents and (-) CABG (-) dysrhythmias (-) pacemaker(-) Valvular Problems/Murmurs Rhythm:Regular Rate:Normal   1. Left ventricular ejection fraction, by estimation, is 60 to 65%. The  left ventricle has normal function. The left ventricle has no regional  wall motion abnormalities. Left ventricular diastolic parameters were  normal.   2. Right ventricular systolic function is normal. The right ventricular  size is normal.   3. The mitral valve is normal in structure. No evidence of mitral valve  regurgitation. No evidence of mitral stenosis.   4. The aortic valve is normal in structure. Aortic valve regurgitation is  not visualized. No aortic stenosis is present.   5. Aneurysm of the ascending aorta, measuring 40 mm.   6. The inferior vena cava is normal in size with greater than 50%  respiratory variability, suggesting right atrial pressure of 3 mmHg.     Neuro/Psych neg Seizures    GI/Hepatic hiatal hernia,neg GERD  ,,(+) neg Cirrhosis        Endo/Other    Renal/GU Renal InsufficiencyRenal disease     Musculoskeletal   Abdominal   Peds  Hematology   Anesthesia Other Findings   Reproductive/Obstetrics                              Anesthesia Physical Anesthesia Plan  ASA:  3  Anesthesia Plan: General   Post-op Pain Management:    Induction: Intravenous  PONV Risk Score and Plan: 2 and Ondansetron  and Dexamethasone   Airway Management Planned: Oral ETT  Additional Equipment: Arterial line  Intra-op Plan:   Post-operative Plan: Extubation in OR  Informed Consent: I have reviewed the patients History and Physical, chart, labs and discussed the procedure including the risks, benefits and alternatives for the proposed anesthesia with the patient or authorized representative who has indicated his/her understanding and acceptance.     Dental advisory given  Plan Discussed with: CRNA  Anesthesia Plan Comments: (PAT note written 11/13/2023 by Allison Zelenak, PA-C.  )         Anesthesia Quick Evaluation

## 2023-11-13 NOTE — Progress Notes (Signed)
 Anesthesia Chart Review:  Case: [id number] Date/Time: 11/17/23 0715   Procedure: INSERTION, ENDOVASCULAR STENT GRAFT, AORTA, ABDOMINAL   Anesthesia type: General   Diagnosis: Abdominal aortic aneurysm (AAA) without rupture, unspecified part (HCC) [I71.40]   Pre-op diagnosis: AAA W/O RUPTURE   [facility]: [facility] OR ROOM 16 / [facility] OR   Surgeons: Gretta Lonni PARAS, MD       DISCUSSION: Patient is a 78 year old male scheduled for the above procedure.  History includes smoking, HTN, COPD, AAA/descending TAA, HLD, palpitations (frequent PVCs, 5.9% burden 11/2022), left femoral fracture (s/p left hip hemiarthroplasty 12/2022). CCTA with FFRct on 01/28/24 demonstrated LOW likelihood of hemodynamically significant coronary stenosis. Daily alcohol.   He has a 5.4 cm  (up fro m4.7 cm) AAA and 4.5 cm descending TAA followed by Dr. Gretta. Above procedure recommended.   Last visit with cardiologist Dr. Liborio was on 01/30/2023. He had 5.9% PVC burden and 18 second run of NSVT on 11/2022 monitor, managed on b-blocker therapy.. 01/2023 TTE showed LVEF 60-65%, no RWMA, normal diastolic parameters, normal RV systolic function, 40 mm ascending aorta.  No significant CAD stenosis by CCTA in 01/2024. Defer use of Plavix to surgeon.   Last Plavix on 11/10/2023.   PAT labs showed a K 2.6 (on hydrochlorothiazide ). UA hazy with small leukocytes. Reportedly results reviewed with Dr. Gretta with recommendation for patient to drink Gatorade, eat high potassium food, and start Macrobid  for UTI. [facility] nursing staff have communicated with the patient. He will need repeat K on the day of surgery.   VS: BP (!) 141/71   Pulse 71   Temp 36.5 C   Resp 17   Ht 6' 2 (1.88 m)   Wt 90.1 kg   SpO2 95%   BMI 25.50 kg/m   PROVIDERS: Fernand Tracey LABOR, MD is PCP [facility]) Liborio Hai, MD is cardiologist Gretta Lonni, MD is vascular surgeon   LABS: Preoperative labs noted. See DISCUSSION. (all labs ordered  are listed, but only abnormal results are displayed)  Labs Reviewed  COMPREHENSIVE METABOLIC PANEL WITH GFR - Abnormal; Notable for the following components:      Result Value   Potassium 2.6 (*)    Chloride 96 (*)    CO2 35 (*)    Glucose, Bld 127 (*)    Albumin 3.3 (*)    Total Bilirubin 1.3 (*)    All other components within normal limits  URINALYSIS, ROUTINE W REFLEX MICROSCOPIC - Abnormal; Notable for the following components:   Color, Urine AMBER (*)    APPearance HAZY (*)    Protein, ur 30 (*)    Leukocytes,Ua SMALL (*)    Bacteria, UA FEW (*)    All other components within normal limits  SURGICAL PCR SCREEN  CBC  APTT  TYPE AND SCREEN     IMAGES: CTA Chest/Abd/Pelvis 10/13/2023: IMPRESSION: 1. 5.4 cm infrarenal abdominal aortic aneurysm. Recommend follow-up every 6 months and vascular consultation. 2. 4.2 cm proximal descending thoracic aortic aneurysm. 3.8 cm distal descending thoracic aortic aneurysm. There is a new focal level of ulcerated plaque in the distal descending thoracic aorta. No dissection. 3. Emphysema. 4. Right lower lobe peribronchial wall thickening with mucous plugging. 5. Stable 12 mm partially calcified nodule in the left lower lobe. 6. Sigmoid colon diverticulosis. 7. Prostatomegaly. - Aortic Atherosclerosis (ICD10-I70.0) and Emphysema (ICD10-J43.9). I   EKG: EKG 03/28/2023 [facility]): By Narrative: Sinus rhythm with PAC 1st degree AVB  EKG 01/30/2024: Sinus tachycardia with 1st  degree A-V block Nonspecific ST and T wave abnormality When compared with ECG of 24-Dec-2022 10:51, Fusion complexes are no longer Present Premature ventricular complexes are no longer Present T wave inversion no longer evident in Inferior leads Nonspecific T wave abnormality, worse in Lateral leads   CV: Echo 01/29/2023: IMPRESSIONS   1. Left ventricular ejection fraction, by estimation, is 60 to 65%. The  left ventricle has normal function. The  left ventricle has no regional  wall motion abnormalities. Left ventricular diastolic parameters were  normal.   2. Right ventricular systolic function is normal. The right ventricular  size is normal.   3. The mitral valve is normal in structure. No evidence of mitral valve  regurgitation. No evidence of mitral stenosis.   4. The aortic valve is normal in structure. Aortic valve regurgitation is  not visualized. No aortic stenosis is present.   5. Aneurysm of the ascending aorta, measuring 40 mm.   6. The inferior vena cava is normal in size with greater than 50%  respiratory variability, suggesting right atrial pressure of 3 mmHg.    CTA Coronary 01/28/2023 ([facility]/PACS): IMPRESSION: 1. Coronary calcium score of 175. This was 8 percentile for age and sex matched control. 2. Normal coronary origin with right dominance. 3. CAD-RADS 3. Moderate stenosis (50-69%) - OM1. Consider symptom-guided anti-ischemic pharmacotherapy as well as risk factor modification per guideline directed care. Additional analysis with CT FFR will be submitted. 4. Advance atherosclerosis of the thoracic aorta noted with penetrating ulcer in the descending portion of the thoracic aorta.   FFRct 01/28/2023 ([facility]/PACS): LM FFR: 1.00 LAD FFR: Prox 0.98, mid 0.95, distal 0.87 CX FFR: Prox 1.00, mid 0.99, distal 0.92 OM1 FFR: Prox 0.99, mid 0.95 RCA FFR: Prox 0.99, mid 0.95, distal 0.94 IMPRESSION: This study demonstrates LOW likelihood of hemodynamically significant stenosis.   Event monitor 11/26/2022: Per Dr. Liborio, 14-day event monitor from 11/26/2022 previously reviewed with average heart rate 84/min, frequent ventricular ectopy 5.9% burden longest run of NSVT 8.8 seconds lasting 18 beats.  Supraventricular ectopy burden 1.5%.       Past Medical History:  Diagnosis Date   AAA (abdominal aortic aneurysm) (HCC)    being monitored by vascular MD, currently 4.5cm   AAA (abdominal aortic aneurysm)  without rupture (HCC) 01/16/2021   Aneurysm of infrarenal abdominal aorta (HCC)    Aortic atherosclerosis (HCC)    Benign essential hypertension    BMI 28.0-28.9,adult    COPD (chronic obstructive pulmonary disease) (HCC)    COPD with emphysema (HCC)    Descending thoracic aortic aneurysm (HCC)    4.3cm, monitored yearly by vascular   ED (erectile dysfunction)    Frequent PVCs 5.9% burden by event monitor 11-26-2022; runs of nonsustained ventricular tachycardia up to 18 beats lasting 8.8 seconds 12/24/2022   Heavy smoker    smokes 1-11/2 ppd   Hypertension    Intermittent palpitations    Mixed hyperlipidemia    Overgrown toenails 11/25/2018   Peripheral vascular disease of lower extremity (HCC)    Precordial pain 12/24/2022    Past Surgical History:  Procedure Laterality Date   COLONOSCOPY     DUPUYTREN CONTRACTURE RELEASE Right 04/05/2021   Procedure: EXCISION DUPUYTREN'S CONTRACTURES RIGHT HAND, LONG FINGER AND THUMB/INDEX WEBSPACE;  Surgeon: Murrell Drivers, MD;  Location: [facility];  Service: Orthopedics;  Laterality: Right;   HAND SURGERY Bilateral    IR RADIOLOGIST EVAL & MGMT  11/06/2021   TOTAL HIP ARTHROPLASTY Left 2024  MEDICATIONS:  testosterone  cypionate (DEPOTESTOSTERONE CYPIONATE) 200 MG/ML injection   albuterol (VENTOLIN HFA) 108 (90 Base) MCG/ACT inhaler   amLODipine-olmesartan (AZOR) 10-40 MG tablet   aspirin 81 MG EC tablet   atorvastatin (LIPITOR) 40 MG tablet   clopidogrel (PLAVIX) 75 MG tablet   doxazosin (CARDURA) 1 MG tablet   fluticasone furoate-vilanterol (BREO ELLIPTA) 100-25 MCG/ACT AEPB   hydrochlorothiazide  (HYDRODIURIL ) 25 MG tablet   metoprolol  tartrate (LOPRESSOR ) 25 MG tablet   nitrofurantoin , macrocrystal-monohydrate, (MACROBID ) 100 MG capsule   STIOLTO RESPIMAT 2.5-2.5 MCG/ACT AERS   traZODone (DESYREL) 50 MG tablet   No current facility-administered medications for this encounter.    Isaiah Ruder, PA-C Surgical  Short Stay/Anesthesiology [facility] Phone [phone] [facility] Phone [phone] 11/13/2023 2:08 PM

## 2023-11-17 ENCOUNTER — Encounter (HOSPITAL_COMMUNITY)
Admission: RE | Disposition: A | Discharge status: Home / Self Care | Payer: Self-pay | Source: Home / Self Care | Attending: Vascular Surgery

## 2023-11-17 ENCOUNTER — Other Ambulatory Visit: Payer: Self-pay

## 2023-11-17 ENCOUNTER — Encounter (HOSPITAL_COMMUNITY): Payer: Self-pay | Admitting: Vascular Surgery

## 2023-11-17 ENCOUNTER — Inpatient Hospital Stay (HOSPITAL_COMMUNITY)

## 2023-11-17 ENCOUNTER — Inpatient Hospital Stay (HOSPITAL_COMMUNITY): Admitting: Anesthesiology

## 2023-11-17 ENCOUNTER — Inpatient Hospital Stay (HOSPITAL_COMMUNITY)
Admission: RE | Admit: 2023-11-17 | Discharge: 2023-11-18 | DRG: 269 | Disposition: A | Discharge status: Home / Self Care | Attending: Vascular Surgery | Admitting: Vascular Surgery

## 2023-11-17 ENCOUNTER — Inpatient Hospital Stay (HOSPITAL_COMMUNITY): Payer: Self-pay | Admitting: Physician Assistant

## 2023-11-17 DIAGNOSIS — I700 Atherosclerosis of aorta: Secondary | ICD-10-CM | POA: Diagnosis not present

## 2023-11-17 DIAGNOSIS — Z7902 Long term (current) use of antithrombotics/antiplatelets: Secondary | ICD-10-CM

## 2023-11-17 DIAGNOSIS — I714 Abdominal aortic aneurysm, without rupture, unspecified: Principal | ICD-10-CM | POA: Diagnosis present

## 2023-11-17 DIAGNOSIS — F17210 Nicotine dependence, cigarettes, uncomplicated: Secondary | ICD-10-CM | POA: Diagnosis present

## 2023-11-17 DIAGNOSIS — I2510 Atherosclerotic heart disease of native coronary artery without angina pectoris: Secondary | ICD-10-CM

## 2023-11-17 DIAGNOSIS — Z8679 Personal history of other diseases of the circulatory system: Principal | ICD-10-CM

## 2023-11-17 DIAGNOSIS — F17200 Nicotine dependence, unspecified, uncomplicated: Secondary | ICD-10-CM

## 2023-11-17 DIAGNOSIS — Z79899 Other long term (current) drug therapy: Secondary | ICD-10-CM

## 2023-11-17 DIAGNOSIS — I739 Peripheral vascular disease, unspecified: Secondary | ICD-10-CM | POA: Diagnosis not present

## 2023-11-17 DIAGNOSIS — Z7982 Long term (current) use of aspirin: Secondary | ICD-10-CM | POA: Diagnosis not present

## 2023-11-17 DIAGNOSIS — I10 Essential (primary) hypertension: Secondary | ICD-10-CM | POA: Diagnosis not present

## 2023-11-17 DIAGNOSIS — Z8249 Family history of ischemic heart disease and other diseases of the circulatory system: Secondary | ICD-10-CM

## 2023-11-17 DIAGNOSIS — E782 Mixed hyperlipidemia: Secondary | ICD-10-CM | POA: Diagnosis present

## 2023-11-17 DIAGNOSIS — Z7951 Long term (current) use of inhaled steroids: Secondary | ICD-10-CM | POA: Diagnosis not present

## 2023-11-17 DIAGNOSIS — J439 Emphysema, unspecified: Secondary | ICD-10-CM | POA: Diagnosis not present

## 2023-11-17 DIAGNOSIS — E876 Hypokalemia: Secondary | ICD-10-CM

## 2023-11-17 DIAGNOSIS — R918 Other nonspecific abnormal finding of lung field: Secondary | ICD-10-CM | POA: Diagnosis not present

## 2023-11-17 HISTORY — PX: ABDOMINAL AORTIC ENDOVASCULAR STENT GRAFT: SHX5707

## 2023-11-17 HISTORY — PX: ULTRASOUND GUIDANCE FOR VASCULAR ACCESS: SHX6516

## 2023-11-17 HISTORY — DX: Personal history of other diseases of the circulatory system: Z86.79

## 2023-11-17 LAB — POCT I-STAT, CHEM 8
BUN: 10 mg/dL (ref 8–23)
Calcium, Ion: 1.17 mmol/L (ref 1.15–1.40)
Chloride: 96 mmol/L — ABNORMAL LOW (ref 98–111)
Creatinine, Ser: 1.3 mg/dL — ABNORMAL HIGH (ref 0.61–1.24)
Glucose, Bld: 102 mg/dL — ABNORMAL HIGH (ref 70–99)
HCT: 36 % — ABNORMAL LOW (ref 39.0–52.0)
Hemoglobin: 12.2 g/dL — ABNORMAL LOW (ref 13.0–17.0)
Potassium: 3.2 mmol/L — ABNORMAL LOW (ref 3.5–5.1)
Sodium: 140 mmol/L (ref 135–145)
TCO2: 30 mmol/L (ref 22–32)

## 2023-11-17 LAB — CBC
HCT: 32.1 % — ABNORMAL LOW (ref 39.0–52.0)
Hemoglobin: 10.8 g/dL — ABNORMAL LOW (ref 13.0–17.0)
MCH: 31.1 pg (ref 26.0–34.0)
MCHC: 33.6 g/dL (ref 30.0–36.0)
MCV: 92.5 fL (ref 80.0–100.0)
Platelets: 182 K/uL (ref 150–400)
RBC: 3.47 MIL/uL — ABNORMAL LOW (ref 4.22–5.81)
RDW: 13 % (ref 11.5–15.5)
WBC: 6.5 K/uL (ref 4.0–10.5)
nRBC: 0 % (ref 0.0–0.2)

## 2023-11-17 LAB — BASIC METABOLIC PANEL WITH GFR
Anion gap: 11 (ref 5–15)
Anion gap: 15 (ref 5–15)
BUN: 10 mg/dL (ref 8–23)
BUN: 11 mg/dL (ref 8–23)
CO2: 28 mmol/L (ref 22–32)
CO2: 23 mmol/L (ref 22–32)
Calcium: 8.4 mg/dL — ABNORMAL LOW (ref 8.9–10.3)
Calcium: 8.5 mg/dL — ABNORMAL LOW (ref 8.9–10.3)
Chloride: 98 mmol/L (ref 98–111)
Chloride: 96 mmol/L — ABNORMAL LOW (ref 98–111)
Creatinine, Ser: 1.13 mg/dL (ref 0.61–1.24)
Creatinine, Ser: 1.15 mg/dL (ref 0.61–1.24)
GFR, Estimated: >60 mL/min (ref >60)
GFR, Estimated: >60 mL/min (ref >60)
Glucose, Bld: 117 mg/dL — ABNORMAL HIGH (ref 70–99)
Glucose, Bld: 190 mg/dL — ABNORMAL HIGH (ref 70–99)
Potassium: 2.8 mmol/L — ABNORMAL LOW (ref 3.5–5.1)
Potassium: 3.4 mmol/L — ABNORMAL LOW (ref 3.5–5.1)
Sodium: 137 mmol/L (ref 135–145)
Sodium: 134 mmol/L — ABNORMAL LOW (ref 135–145)

## 2023-11-17 LAB — APTT: aPTT: 45 s — ABNORMAL HIGH (ref 24–36)

## 2023-11-17 LAB — MAGNESIUM
Magnesium: 1.9 mg/dL (ref 1.7–2.4)
Magnesium: 1.8 mg/dL (ref 1.7–2.4)

## 2023-11-17 LAB — PROTIME-INR
INR: 1.2 (ref 0.8–1.2)
Prothrombin Time: 15.5 s — ABNORMAL HIGH (ref 11.4–15.2)

## 2023-11-17 LAB — ABO/RH: ABO/RH(D): O POS

## 2023-11-17 SURGERY — INSERTION, ENDOVASCULAR STENT GRAFT, AORTA, ABDOMINAL
Anesthesia: General | Site: Groin | Laterality: Bilateral

## 2023-11-17 MED ORDER — METOPROLOL TARTRATE 5 MG/5ML IV SOLN: 2.5000 mg | INTRAVENOUS | Status: DC | PRN

## 2023-11-17 MED ORDER — ESMOLOL HCL 100 MG/10ML IV SOLN
INTRAVENOUS | Status: DC | PRN
  Administered 2023-11-17 (×2): 15 mg via INTRAVENOUS

## 2023-11-17 MED ORDER — PROPOFOL 10 MG/ML IV BOLUS
INTRAVENOUS | Status: DC | PRN
  Administered 2023-11-17: 120 mg via INTRAVENOUS

## 2023-11-17 MED ORDER — IODIXANOL 320 MG/ML IV SOLN
INTRAVENOUS | Status: DC | PRN
  Administered 2023-11-17: 95 mL via INTRA_ARTERIAL

## 2023-11-17 MED ORDER — LACTATED RINGERS IV SOLN: INTRAVENOUS | Status: DC

## 2023-11-17 MED ORDER — DEXAMETHASONE SODIUM PHOSPHATE 10 MG/ML IJ SOLN
INTRAMUSCULAR | Status: DC | PRN
  Administered 2023-11-17: 10 mg via INTRAVENOUS

## 2023-11-17 MED ORDER — OXYCODONE HCL 5 MG PO TABS: 5.0000 mg | ORAL_TABLET | Freq: Once | ORAL | Status: DC | PRN

## 2023-11-17 MED ORDER — EPHEDRINE SULFATE-NACL 50-0.9 MG/10ML-% IV SOSY
PREFILLED_SYRINGE | INTRAVENOUS | Status: DC | PRN
  Administered 2023-11-17: 5 mg via INTRAVENOUS
  Administered 2023-11-17: 15 mg via INTRAVENOUS

## 2023-11-17 MED ORDER — ORAL CARE MOUTH RINSE: 15.0000 mL | Freq: Once | OROMUCOSAL | Status: AC

## 2023-11-17 MED ORDER — SUGAMMADEX SODIUM 200 MG/2ML IV SOLN
INTRAVENOUS | Status: DC | PRN
  Administered 2023-11-17: 200 mg via INTRAVENOUS

## 2023-11-17 MED ORDER — CHLORHEXIDINE GLUCONATE CLOTH 2 % EX PADS: 6.0000 | MEDICATED_PAD | Freq: Once | CUTANEOUS | Status: DC

## 2023-11-17 MED ORDER — ACETAMINOPHEN 650 MG RE SUPP: 325.0000 mg | RECTAL | Status: DC | PRN

## 2023-11-17 MED ORDER — PHENYLEPHRINE 80 MCG/ML (10ML) SYRINGE FOR IV PUSH (FOR BLOOD PRESSURE SUPPORT)
PREFILLED_SYRINGE | INTRAVENOUS | Status: AC
  Filled 2023-11-17: qty 10

## 2023-11-17 MED ORDER — HEPARIN 6000 UNIT IRRIGATION SOLUTION
Status: DC | PRN
  Administered 2023-11-17: 1

## 2023-11-17 MED ORDER — FENTANYL CITRATE (PF) 250 MCG/5ML IJ SOLN
INTRAMUSCULAR | Status: DC | PRN
  Administered 2023-11-17: 50 ug via INTRAVENOUS
  Administered 2023-11-17 (×2): 25 ug via INTRAVENOUS

## 2023-11-17 MED ORDER — OXYCODONE HCL 5 MG/5ML PO SOLN: 5.0000 mg | Freq: Once | ORAL | Status: DC | PRN

## 2023-11-17 MED ORDER — BISACODYL 10 MG RE SUPP: 10.0000 mg | Freq: Every day | RECTAL | Status: DC | PRN

## 2023-11-17 MED ORDER — HEPARIN SODIUM (PORCINE) 1000 UNIT/ML IJ SOLN
INTRAMUSCULAR | Status: DC | PRN
  Administered 2023-11-17: 2000 [IU] via INTRAVENOUS
  Administered 2023-11-17: 9000 [IU] via INTRAVENOUS

## 2023-11-17 MED ORDER — PHENOL 1.4 % MT LIQD: 1.0000 | OROMUCOSAL | Status: DC | PRN

## 2023-11-17 MED ORDER — ATORVASTATIN CALCIUM 40 MG PO TABS
40.0000 mg | ORAL_TABLET | Freq: Every day | ORAL | Status: DC
  Administered 2023-11-18: 40 mg via ORAL
  Filled 2023-11-17: qty 1

## 2023-11-17 MED ORDER — ALBUTEROL SULFATE (2.5 MG/3ML) 0.083% IN NEBU
3.0000 mL | INHALATION_SOLUTION | Freq: Every day | RESPIRATORY_TRACT | Status: DC
  Administered 2023-11-18: 3 mL via RESPIRATORY_TRACT
  Filled 2023-11-17: qty 3

## 2023-11-17 MED ORDER — EPINEPHRINE PF 1 MG/ML IJ SOLN
INTRAMUSCULAR | Status: DC | PRN
  Administered 2023-11-17 (×5): .01 mg via INTRAVENOUS

## 2023-11-17 MED ORDER — 0.9 % SODIUM CHLORIDE (POUR BTL) OPTIME
TOPICAL | Status: DC | PRN
  Administered 2023-11-17: 1000 mL

## 2023-11-17 MED ORDER — DOCUSATE SODIUM 100 MG PO CAPS
100.0000 mg | ORAL_CAPSULE | Freq: Every day | ORAL | Status: DC
  Filled 2023-11-17: qty 1

## 2023-11-17 MED ORDER — ONDANSETRON HCL 4 MG/2ML IJ SOLN
INTRAMUSCULAR | Status: DC | PRN
  Administered 2023-11-17: 4 mg via INTRAVENOUS

## 2023-11-17 MED ORDER — DEXAMETHASONE SODIUM PHOSPHATE 10 MG/ML IJ SOLN
INTRAMUSCULAR | Status: AC
  Filled 2023-11-17: qty 1

## 2023-11-17 MED ORDER — FENTANYL CITRATE (PF) 100 MCG/2ML IJ SOLN
INTRAMUSCULAR | Status: AC
  Filled 2023-11-17: qty 2

## 2023-11-17 MED ORDER — PHENYLEPHRINE HCL-NACL 20-0.9 MG/250ML-% IV SOLN
INTRAVENOUS | Status: DC | PRN
  Administered 2023-11-17: 50 ug/min via INTRAVENOUS

## 2023-11-17 MED ORDER — HYDRALAZINE HCL 20 MG/ML IJ SOLN
10.0000 mg | Freq: Four times a day (QID) | INTRAMUSCULAR | Status: DC | PRN
  Administered 2023-11-17 – 2023-11-18 (×2): 10 mg via INTRAVENOUS
  Filled 2023-11-17: qty 1

## 2023-11-17 MED ORDER — CEFAZOLIN SODIUM-DEXTROSE 2-4 GM/100ML-% IV SOLN
2.0000 g | INTRAVENOUS | Status: AC
  Administered 2023-11-17: 2 g via INTRAVENOUS
  Filled 2023-11-17: qty 100

## 2023-11-17 MED ORDER — AMLODIPINE BESYLATE 10 MG PO TABS
10.0000 mg | ORAL_TABLET | Freq: Every day | ORAL | Status: DC
  Administered 2023-11-17 – 2023-11-18 (×2): 10 mg via ORAL
  Filled 2023-11-17 (×2): qty 1

## 2023-11-17 MED ORDER — SODIUM CHLORIDE 0.9 % IV SOLN: INTRAVENOUS | Status: DC

## 2023-11-17 MED ORDER — EPHEDRINE 5 MG/ML INJ
INTRAVENOUS | Status: AC
  Filled 2023-11-17: qty 5

## 2023-11-17 MED ORDER — ROCURONIUM BROMIDE 10 MG/ML (PF) SYRINGE
PREFILLED_SYRINGE | INTRAVENOUS | Status: AC
  Filled 2023-11-17: qty 10

## 2023-11-17 MED ORDER — ROCURONIUM BROMIDE 10 MG/ML (PF) SYRINGE
PREFILLED_SYRINGE | INTRAVENOUS | Status: DC | PRN
  Administered 2023-11-17: 70 mg via INTRAVENOUS

## 2023-11-17 MED ORDER — EPINEPHRINE PF 1 MG/ML IJ SOLN: INTRAMUSCULAR | Status: DC | PRN

## 2023-11-17 MED ORDER — PROPOFOL 10 MG/ML IV BOLUS
INTRAVENOUS | Status: AC
  Filled 2023-11-17: qty 20

## 2023-11-17 MED ORDER — CHLORHEXIDINE GLUCONATE 0.12 % MT SOLN
15.0000 mL | Freq: Once | OROMUCOSAL | Status: AC
  Administered 2023-11-17: 15 mL via OROMUCOSAL
  Filled 2023-11-17: qty 15

## 2023-11-17 MED ORDER — HEPARIN SODIUM (PORCINE) 1000 UNIT/ML IJ SOLN
INTRAMUSCULAR | Status: AC
  Filled 2023-11-17: qty 10

## 2023-11-17 MED ORDER — DOXAZOSIN MESYLATE 1 MG PO TABS
1.0000 mg | ORAL_TABLET | Freq: Every day | ORAL | Status: DC
  Administered 2023-11-17: 1 mg via ORAL
  Filled 2023-11-17 (×2): qty 1

## 2023-11-17 MED ORDER — ALBUMIN HUMAN 5 % IV SOLN: INTRAVENOUS | Status: DC | PRN

## 2023-11-17 MED ORDER — HEPARIN SODIUM (PORCINE) 5000 UNIT/ML IJ SOLN
5000.0000 [IU] | Freq: Three times a day (TID) | INTRAMUSCULAR | Status: DC
  Administered 2023-11-18: 5000 [IU] via SUBCUTANEOUS
  Filled 2023-11-17: qty 1

## 2023-11-17 MED ORDER — PANTOPRAZOLE SODIUM 40 MG PO TBEC
40.0000 mg | DELAYED_RELEASE_TABLET | Freq: Every day | ORAL | Status: DC
  Administered 2023-11-17 – 2023-11-18 (×2): 40 mg via ORAL
  Filled 2023-11-17 (×2): qty 1

## 2023-11-17 MED ORDER — ALBUTEROL SULFATE HFA 108 (90 BASE) MCG/ACT IN AERS
INHALATION_SPRAY | RESPIRATORY_TRACT | Status: DC | PRN
Start: 2023-11-17 — End: 2023-11-17
  Administered 2023-11-17: 6 via RESPIRATORY_TRACT

## 2023-11-17 MED ORDER — AMLODIPINE-OLMESARTAN 10-40 MG PO TABS: 1.0000 | ORAL_TABLET | Freq: Every day | ORAL | Status: DC

## 2023-11-17 MED ORDER — ACETAMINOPHEN 10 MG/ML IV SOLN
1000.0000 mg | Freq: Once | INTRAVENOUS | Status: DC | PRN
Start: 2023-11-17 — End: 2023-11-17
  Administered 2023-11-17: 1000 mg via INTRAVENOUS

## 2023-11-17 MED ORDER — ACETAMINOPHEN 10 MG/ML IV SOLN
INTRAVENOUS | Status: AC
  Filled 2023-11-17: qty 100

## 2023-11-17 MED ORDER — TRAZODONE HCL 50 MG PO TABS
100.0000 mg | ORAL_TABLET | Freq: Every evening | ORAL | Status: DC | PRN
  Administered 2023-11-17: 100 mg via ORAL
  Filled 2023-11-17: qty 2

## 2023-11-17 MED ORDER — MIDAZOLAM HCL 2 MG/2ML IJ SOLN
INTRAMUSCULAR | Status: AC
  Filled 2023-11-17: qty 2

## 2023-11-17 MED ORDER — HYDROCHLOROTHIAZIDE 25 MG PO TABS
25.0000 mg | ORAL_TABLET | Freq: Every day | ORAL | Status: DC
  Administered 2023-11-17 – 2023-11-18 (×2): 25 mg via ORAL
  Filled 2023-11-17 (×2): qty 1

## 2023-11-17 MED ORDER — HYDROMORPHONE HCL 1 MG/ML IJ SOLN: 0.5000 mg | INTRAMUSCULAR | Status: DC | PRN

## 2023-11-17 MED ORDER — PROTAMINE SULFATE 10 MG/ML IV SOLN
INTRAVENOUS | Status: DC | PRN
  Administered 2023-11-17: 50 mg via INTRAVENOUS

## 2023-11-17 MED ORDER — LIDOCAINE 2% (20 MG/ML) 5 ML SYRINGE
INTRAMUSCULAR | Status: DC | PRN
  Administered 2023-11-17: 100 mg via INTRAVENOUS

## 2023-11-17 MED ORDER — MIDAZOLAM HCL 2 MG/2ML IJ SOLN
INTRAMUSCULAR | Status: DC | PRN
  Administered 2023-11-17: 2 mg via INTRAVENOUS

## 2023-11-17 MED ORDER — PHENYLEPHRINE 80 MCG/ML (10ML) SYRINGE FOR IV PUSH (FOR BLOOD PRESSURE SUPPORT)
PREFILLED_SYRINGE | INTRAVENOUS | Status: DC | PRN
  Administered 2023-11-17: 80 ug via INTRAVENOUS

## 2023-11-17 MED ORDER — ASPIRIN 81 MG PO TBEC: 81.0000 mg | DELAYED_RELEASE_TABLET | Freq: Every day | ORAL | Status: DC

## 2023-11-17 MED ORDER — CARVEDILOL 6.25 MG PO TABS
6.2500 mg | ORAL_TABLET | Freq: Two times a day (BID) | ORAL | Status: DC
  Administered 2023-11-17 – 2023-11-18 (×2): 6.25 mg via ORAL
  Filled 2023-11-17 (×2): qty 1

## 2023-11-17 MED ORDER — HEPARIN 6000 UNIT IRRIGATION SOLUTION
Status: AC
  Filled 2023-11-17: qty 500

## 2023-11-17 MED ORDER — POLYETHYLENE GLYCOL 3350 17 G PO PACK: 17.0000 g | PACK | Freq: Every day | ORAL | Status: DC | PRN

## 2023-11-17 MED ORDER — ORAL CARE MOUTH RINSE: 15.0000 mL | OROMUCOSAL | Status: DC | PRN

## 2023-11-17 MED ORDER — FENTANYL CITRATE (PF) 100 MCG/2ML IJ SOLN
25.0000 ug | INTRAMUSCULAR | Status: DC | PRN
  Administered 2023-11-17: 25 ug via INTRAVENOUS

## 2023-11-17 MED ORDER — CEFAZOLIN SODIUM-DEXTROSE 2-4 GM/100ML-% IV SOLN
2.0000 g | Freq: Three times a day (TID) | INTRAVENOUS | Status: AC
  Administered 2023-11-17 (×2): 2 g via INTRAVENOUS
  Filled 2023-11-17 (×2): qty 100

## 2023-11-17 MED ORDER — IRBESARTAN 300 MG PO TABS
300.0000 mg | ORAL_TABLET | Freq: Every day | ORAL | Status: DC
  Administered 2023-11-17 – 2023-11-18 (×2): 300 mg via ORAL
  Filled 2023-11-17 (×2): qty 1

## 2023-11-17 MED ORDER — ACETAMINOPHEN 325 MG PO TABS: 325.0000 mg | ORAL_TABLET | ORAL | Status: DC | PRN

## 2023-11-17 MED ORDER — FLUTICASONE FUROATE-VILANTEROL 100-25 MCG/ACT IN AEPB
1.0000 | INHALATION_SPRAY | Freq: Every day | RESPIRATORY_TRACT | Status: DC
  Administered 2023-11-17 – 2023-11-18 (×2): 1 via RESPIRATORY_TRACT
  Filled 2023-11-17: qty 28

## 2023-11-17 MED ORDER — POTASSIUM CHLORIDE CRYS ER 20 MEQ PO TBCR
40.0000 meq | EXTENDED_RELEASE_TABLET | Freq: Every day | ORAL | Status: DC | PRN
  Administered 2023-11-17 (×2): 60 meq via ORAL
  Filled 2023-11-17 (×2): qty 3

## 2023-11-17 MED ORDER — FENTANYL CITRATE (PF) 250 MCG/5ML IJ SOLN
INTRAMUSCULAR | Status: AC
  Filled 2023-11-17: qty 5

## 2023-11-17 MED ORDER — ONDANSETRON HCL 4 MG/2ML IJ SOLN: 4.0000 mg | Freq: Once | INTRAMUSCULAR | Status: DC | PRN

## 2023-11-17 MED ORDER — LIDOCAINE 2% (20 MG/ML) 5 ML SYRINGE
INTRAMUSCULAR | Status: AC
  Filled 2023-11-17: qty 5

## 2023-11-17 MED ORDER — OXYCODONE-ACETAMINOPHEN 5-325 MG PO TABS: 1.0000 | ORAL_TABLET | ORAL | Status: DC | PRN

## 2023-11-17 MED ORDER — FLUTICASONE FUROATE-VILANTEROL 100-25 MCG/ACT IN AEPB
2.0000 | INHALATION_SPRAY | Freq: Every day | RESPIRATORY_TRACT | Status: DC
  Filled 2023-11-17: qty 28

## 2023-11-17 SURGICAL SUPPLY — 34 items
BAG COUNTER SPONGE SURGICOUNT (BAG) ×1 IMPLANT
CANISTER SUCTION 3000ML PPV (SUCTIONS) ×1 IMPLANT
CATH BEACON 5.038 65CM KMP-01 (CATHETERS) ×1 IMPLANT
CATH OMNI FLUSH .035X70CM (CATHETERS) ×1 IMPLANT
DERMABOND ADVANCED .7 DNX12 (GAUZE/BANDAGES/DRESSINGS) ×1 IMPLANT
DEVICE CLOSURE PERCLS PRGLD 6F (VASCULAR PRODUCTS) ×4 IMPLANT
DRAPE C-ARM 42X72 X-RAY (DRAPES) IMPLANT
DRSG TEGADERM 2-3/8X2-3/4 SM (GAUZE/BANDAGES/DRESSINGS) ×2 IMPLANT
ELECTRODE REM PT RTRN 9FT ADLT (ELECTROSURGICAL) ×2 IMPLANT
EXCLUDER TNK 28.5X14.5X12 16F (Endovascular Graft) IMPLANT
GAUZE SPONGE 2X2 8PLY STRL LF (GAUZE/BANDAGES/DRESSINGS) ×1 IMPLANT
GLOVE BIO SURGEON STRL SZ7.5 (GLOVE) ×1 IMPLANT
GLOVE BIOGEL PI IND STRL 8 (GLOVE) ×1 IMPLANT
GOWN STRL REUS W/ TWL LRG LVL3 (GOWN DISPOSABLE) ×3 IMPLANT
GOWN STRL REUS W/ TWL XL LVL3 (GOWN DISPOSABLE) ×1 IMPLANT
GRAFT BALLN CATH 65CM (BALLOONS) ×1 IMPLANT
KIT BASIN OR (CUSTOM PROCEDURE TRAY) ×1 IMPLANT
KIT TURNOVER KIT B (KITS) ×1 IMPLANT
LEG CONTRALATERAL 18X11.5 (Endovascular Graft) IMPLANT
NS IRRIG 1000ML POUR BTL (IV SOLUTION) ×1 IMPLANT
PACK ENDOVASCULAR (PACKS) ×1 IMPLANT
PAD ARMBOARD POSITIONER FOAM (MISCELLANEOUS) ×2 IMPLANT
SET MICROPUNCTURE 5F STIFF (MISCELLANEOUS) ×1 IMPLANT
SHEATH BRITE TIP 8FR 23CM (SHEATH) ×1 IMPLANT
SHEATH DRYSEAL FLEX 12FR 33CM (SHEATH) IMPLANT
SHEATH PINNACLE 8F 10CM (SHEATH) ×1 IMPLANT
STENT GRAFT CONTRALAT 16X13.5 (Endovascular Graft) IMPLANT
STOPCOCK MORSE 400PSI 3WAY (MISCELLANEOUS) ×1 IMPLANT
SUT MNCRL AB 4-0 PS2 18 (SUTURE) ×1 IMPLANT
SYR 20ML LL LF (SYRINGE) ×1 IMPLANT
TOWEL GREEN STERILE (TOWEL DISPOSABLE) ×1 IMPLANT
TRAY FOLEY MTR SLVR 16FR STAT (SET/KITS/TRAYS/PACK) ×1 IMPLANT
WIRE AMPLATZ SS-J .035X180CM (WIRE) ×2 IMPLANT
WIRE BENTSON .035X145CM (WIRE) ×2 IMPLANT

## 2023-11-17 NOTE — Transfer of Care (Signed)
 Immediate Anesthesia Transfer of Care Note  Patient: Brian Ibarra  Procedure(s) Performed: INSERTION, ENDOVASCULAR STENT GRAFT, AORTA, ABDOMINAL (Bilateral: Groin) ULTRASOUND GUIDANCE, FOR VASCULAR ACCESS (Bilateral: Groin)  Patient Location: PACU  Anesthesia Type:General  Level of Consciousness: awake and alert   Airway & Oxygen  Therapy: Patient Spontanous Breathing and Patient connected to face mask oxygen   Post-op Assessment: Report given to RN, Post -op Vital signs reviewed and stable, and Patient moving all extremities X 4  Post vital signs: Reviewed and stable  Last Vitals:  Vitals Value Taken Time  BP 119/61 11/17/23 10:15  Temp    Pulse 65 11/17/23 10:17  Resp 14 11/17/23 10:17  SpO2 93 % 11/17/23 10:17  Vitals shown include unfiled device data.  Last Pain:  Vitals:   11/17/23 0612  TempSrc:   PainSc: 0-No pain      Patients Stated Pain Goal: 0 (11/17/23 0612)  Complications: No notable events documented.

## 2023-11-17 NOTE — Anesthesia Procedure Notes (Signed)
 Arterial Line Insertion Start/End8/25/2025 7:50 AM, 11/17/2023 7:55 AM Performed by: Keneth Lynwood POUR, MD, Julien Manus, CRNA, anesthesiologist  Patient location: Pre-op. Preanesthetic checklist: patient identified, IV checked, site marked, risks and benefits discussed, surgical consent, monitors and equipment checked, pre-op evaluation, timeout performed and anesthesia consent Lidocaine  1% used for infiltration Left, radial was placed Catheter size: 20 G Hand hygiene performed  and maximum sterile barriers used   Attempts: 1 Procedure performed without using ultrasound guided technique. Following insertion, dressing applied. Post procedure assessment: normal and unchanged  Patient tolerated the procedure well with no immediate complications.

## 2023-11-17 NOTE — Plan of Care (Signed)

## 2023-11-17 NOTE — Discharge Instructions (Signed)

## 2023-11-17 NOTE — Anesthesia Postprocedure Evaluation (Signed)
 Anesthesia Post Note  Patient: Brian Ibarra  Procedure(s) Performed: INSERTION, ENDOVASCULAR STENT GRAFT, AORTA, ABDOMINAL (Bilateral: Groin) ULTRASOUND GUIDANCE, FOR VASCULAR ACCESS (Bilateral: Groin)     Patient location during evaluation: PACU Anesthesia Type: General Level of consciousness: awake and alert Pain management: pain level controlled Vital Signs Assessment: post-procedure vital signs reviewed and stable Respiratory status: spontaneous breathing, nonlabored ventilation, respiratory function stable and patient connected to nasal cannula oxygen  Cardiovascular status: blood pressure returned to baseline and stable Postop Assessment: no apparent nausea or vomiting Anesthetic complications: no   No notable events documented.  Last Vitals:  Vitals:   11/17/23 1515 11/17/23 1622  BP: 96/81   Pulse: 86   Resp: (!) 21   Temp:  36.4 C  SpO2: 93%     Last Pain:  Vitals:   11/17/23 1622  TempSrc: Oral  PainSc:                  Lynwood MARLA Cornea

## 2023-11-17 NOTE — Op Note (Signed)
 Date: November 17, 2023  Pre-operative diagnosis: 5.5 cm abdominal aortic aneurysm  Post-operative diagnosis: same  Procedure: 1.  Ultrasound-guided access bilateral common femoral arteries for delivery of Perclose closure device 2.  Aortogram with catheter selection of aorta 3.  Repair of abdominal aortic aneurysm with aortobiiliac stent graft (28 mm x 14 mm x 12 cm main body right, left iliac limb with a 16 mm x 14 cm, and right iliac limb with an 18 mm x 12 cm)  Surgeon: Dr. Lonni DOROTHA Gaskins, MD  Assistant: Lucie Apt, PA  Indications: Patient is a 78 year old male with a 5.5 cm abdominal aortic aneurysm.  Patient presents for stent graft repair after risks benefits discussed.  An assistant was needed given the complexity the case and also requiring wire/sheath exchange and stent graft deployment of the endograft.  Findings: Ultrasound-guided access bilateral common femoral arteries with Perclose closure deployment at 10:00 and 2:00.  A 16 French Gore dry seal sheath was placed in the right common femoral artery and a 12 French Gore dry seal sheath was placed in the left common femoral artery.  After stiff wires were placed, we delivered the main body endograft on the right which was a 28 mm x 14 mm at 12 cm deployed below the right renal.  After the gate was cannulated on the left we extended into the common iliac with preservation of the hypogastric with a 16 mm 14 cm limb.  On the right we extended with an 18 mm x 12 cm limb into the right common iliac artery.  All proximal and distal landing zones and overlapping components were treated with MOB balloon.  Widely patent endograft at completion with no endoleak and exclusion of aneurysm.  Anesthesia: General  Details: Patient was taken to the operating room after informed consent was obtained.  Placed on the operative table in supine position.  General endotracheal anesthesia was induced.  The bilateral groins and the abdominal wall  were prepped and draped in standard sterile fashion.  Antibiotics were given and timeout performed.  Initially used ultrasound to evaluate the right common femoral artery, it was patent, an image was saved.  This was accessed with micro access needle and placed a microwire.  We made a stab incision over the wire and placed a microsheath.  I then placed a Bentson wire and dilated with an 8 [place] dilator.  I then used fluoroscopy to deploy Perclose closure devices at 10:00 and 2:00 in the right common femoral artery then placed a short 8 French sheath.  We then did the same steps in the left groin with access  under ultrasound guidance in the common femoral artery and again placing Perclose devices at 10:00 and 2:00.  I then placed an 8 [place] sheath.  I then exchanged for stiff wires with amplatz wires into the thoracic aorta.  The patient was given 100 units per kilogram IV heparin .  ACT was checked to maintain greater than 250.  I upsized to a 16 [place] Gore dry seal sheath in the right common femoral artery and a 12 French Gore dry seal sheath in the left common femoral artery.  We delivered the main body graft up the right side with a pigtail in the left.  We then got the cath at the level of the renal arteries and got 25 degrees cranial to identify the lowest right renal with abdominal aortogram.  The main body of the endograft was deployed down the gate.  I did reconstrain  this slightly and pulled the graft down as we get a second image to confirm that we did not encroach on the right renal.  I then came from the left groin with a buddy wire using a KMP catheter and Bentson wire and cannulated the gate with the help of my assistant.  We spun the pigtail catheter in the main body of the endograft confirming true lumen.  I then placed a stiff wire with a marker pigtail catheter and got a retrograde shot to identify the hypogastric.  On the left we extended with a 16 mm x 14 cm with a limb into the left common  iliac artery.  I then finished deploying the main body of the graft on the right side and then extended with a right iliac limb into the common iliac artery with an 18 mm x 12 cm.  All proximal and distal landing zones and overlapping components were treated with MOB balloon.  Final aortogram showed widely patent renals with the exclusion of the aneurysm and widely patent endograft.  I did tie down the Perclose devices at 10:00 and 2:00 in the right groin while removing the 16 French sheath holding manual pressure.  I shot up picture of the right groin with microcatheter showing a widely patent artery.  I then did the same steps in the left groin and then removing the 12 French sheath and tied down the Perclose devices with good hemostasis at getting a image with a microcatheter.  We finished securing all the Perclose closure devices.  We checked the feet and had good signals.  Protamine  was given for reversal.  The Perclose closure sutures were cut and both groins were closed with 4-0 Monocryl Dermabond.  Awakened from anesthesia and taken to recovery in stable condition.   Complication: None  Condition: Stable  Lonni DOROTHA Gaskins, MD [facility] [place] Office: [phone]   [name]

## 2023-11-17 NOTE — H&P (Signed)
 History and Physical Interval Note:  11/17/2023 7:28 AM  Brian Ibarra  has presented today for surgery, with the diagnosis of AAA W/O RUPTURE.  The various methods of treatment have been discussed with the patient and family. After consideration of risks, benefits and other options for treatment, the patient has consented to  Procedure(s): INSERTION, ENDOVASCULAR STENT GRAFT, AORTA, ABDOMINAL (N/A) as a surgical intervention.  The patient's history has been reviewed, patient examined, no change in status, stable for surgery.  I have reviewed the patient's chart and labs.  Questions were answered to the patient's satisfaction.    EVAR for 5.5 cm AAA  Brian Ibarra      Patient name: Brian Ibarra            MRN: [id number]        DOB: 06-06-1945            Sex: male   REASON FOR CONSULT: 1 month follow-up AAA after CTA   HPI: Brian Ibarra is a 78 y.o. male, with history of COPD, hypertension, tobacco abuse that presents for follow-up after CTA for AAA.   Seen 09/16/2023 with a 5.4 cm AAA and sent for CT.   We are also following a 4.5 cm descending thoracic aneurysm last evaluated on 01/28/2023.     He presents for follow-up to discuss repair of his AAA.  No other concerns today other than some pain down his left hip into the foot that bothers him for several minutes in the morning           Past Medical History:  Diagnosis Date   AAA (abdominal aortic aneurysm) (HCC)      being monitored by vascular MD, currently 4.5cm   AAA (abdominal aortic aneurysm) without rupture (HCC) 01/16/2021   Aneurysm of infrarenal abdominal aorta (HCC)     Aortic atherosclerosis (HCC)     Benign essential hypertension     BMI 28.0-28.9,adult     COPD (chronic obstructive pulmonary disease) (HCC)     COPD with emphysema (HCC)     Descending thoracic aortic aneurysm (HCC)      4.3cm, monitored yearly by vascular   ED (erectile dysfunction)     Frequent PVCs 5.9% burden by event monitor 11-26-2022;  runs of nonsustained ventricular tachycardia up to 18 beats lasting 8.8 seconds 12/24/2022   Heavy smoker      smokes 1-11/2 ppd   Hypertension     Intermittent palpitations     Mixed hyperlipidemia     Overgrown toenails 11/25/2018   Peripheral vascular disease of lower extremity (HCC)     Precordial pain 12/24/2022        PSH: None        Family History  Problem Relation Age of Onset   Heart disease Mother     Cancer Mother     Cancer Father            SOCIAL HISTORY: Social History         Socioeconomic History   Marital status: Divorced      Spouse name: Not on file   Number of children: Not on file   Years of education: Not on file   Highest education level: Not on file  Occupational History   Not on file  Tobacco Use   Smoking status: Every Day      Current packs/day: 1.50      Types: Cigarettes   Smokeless tobacco: Former  Advertising account planner  Vaping status: Never Used  Substance and Sexual Activity   Alcohol use: Yes      Comment: drinks 1 burbon nightly   Drug use: Never   Sexual activity: Yes  Other Topics Concern   Not on file  Social History Narrative   Not on file    Social Drivers of Health    Financial Resource Strain: Not on file  Food Insecurity: Not on file  Transportation Needs: Not on file  Physical Activity: Not on file  Stress: Not on file  Social Connections: Not on file  Intimate Partner Violence: Not on file      Allergies  No Known Allergies           Current Outpatient Medications  Medication Sig Dispense Refill   albuterol  (VENTOLIN  HFA) 108 (90 Base) MCG/ACT inhaler Inhale 2 puffs into the lungs daily.       amLODipine -olmesartan  (AZOR ) 10-40 MG tablet Take 1 tablet by mouth daily.       aspirin  81 MG EC tablet Take 81 mg by mouth daily.       atorvastatin  (LIPITOR) 40 MG tablet Take 40 mg by mouth daily.       clopidogrel  (PLAVIX ) 75 MG tablet Take 75 mg by mouth daily.       doxazosin  (CARDURA ) 1 MG tablet Take 1 mg by  mouth at bedtime.       fluticasone  furoate-vilanterol (BREO ELLIPTA ) 100-25 MCG/ACT AEPB Inhale 2 puffs into the lungs daily.       hydrochlorothiazide  (HYDRODIURIL ) 25 MG tablet Take 1 tablet (25 mg total) by mouth daily. 90 tablet 3   metoprolol  tartrate (LOPRESSOR ) 25 MG tablet Take 1 tablet (25 mg total) by mouth 2 (two) times daily. 180 tablet 3   STIOLTO RESPIMAT 2.5-2.5 MCG/ACT AERS Inhale 2 puffs into the lungs daily.       traZODone  (DESYREL ) 50 MG tablet Take 100 mg by mouth at bedtime as needed.          No current facility-administered medications for this visit.        REVIEW OF SYSTEMS:  [X]  denotes positive finding, [ ]  denotes negative finding Cardiac   Comments:  Chest pain or chest pressure:      Shortness of breath upon exertion:      Short of breath when lying flat:      Irregular heart rhythm:             Vascular      Pain in calf, thigh, or hip brought on by ambulation:      Pain in feet at night that wakes you up from your sleep:       Blood clot in your veins:      Leg swelling:              Pulmonary      Oxygen  at home:      Productive cough:       Wheezing:              Neurologic      Sudden weakness in arms or legs:       Sudden numbness in arms or legs:       Sudden onset of difficulty speaking or slurred speech:      Temporary loss of vision in one eye:       Problems with dizziness:              Gastrointestinal  Blood in stool:       Vomited blood:              Genitourinary      Burning when urinating:       Blood in urine:             Psychiatric      Major depression:              Hematologic      Bleeding problems:      Problems with blood clotting too easily:             Skin      Rashes or ulcers:             Constitutional      Fever or chills:          PHYSICAL EXAM: There were no vitals filed for this visit.     GENERAL: The patient is a well-nourished male, in no acute distress. The vital signs are  documented above. CARDIAC: There is a regular rate and rhythm.  VASCULAR:  Palpable femoral pulses bilateral groins Right DP palpable Difficult to palpate left pedal pulse PULMONARY: No respiratory distress. ABDOMEN: Soft and non-tender.  No pain with deep palpation. MUSCULOSKELETAL: There are no major deformities or cyanosis. NEUROLOGIC: No focal weakness or paresthesias are detected. SKIN: There are no ulcers or rashes noted. PSYCHIATRIC: The patient has a normal affect.   DATA:    CTA reviewed from 10/13/2023 with essentially 5.5 cm AAA as well as a 4.2 cm proximal descending and 3.8 cm distal descending thoracic aneurysm   AAA duplex previously showed 5.2 --> 5.4 cm over past 6 months   CT chest w/o contrast 01/28/23 shows stable 4.5 cm descending thoracic aneurysm (compared to 01/14/2022)   Assessment/Plan:    78 y.o. male, with history of COPD, hypertension, tobacco abuse that presents for follow-up after CTA for AAA.   Seen 09/16/2023 with a 5.4 cm AAA by duplex and sent for CT. Discussed in the past year his aneurysm has increased from 4.7 cm to now essentially 5.5 cm.  I have recommended stent graft repair.  I discussed in men we typically repair these at 5.5 cm and also with continued growth I think it is time to proceed.  I discussed bilateral transfemoral access with aortobiiliac stent graft repair of abdominal aortic aneurysm.  Will get scheduled at [facility] in the OR.  Risk benefits discussed including risk of bleeding, infection, groin problems including bleeding and hematoma, risk of stroke MI anesthesia etc. including renal insufficiency.  All questions answered.     Brian DOROTHA Gaskins, MD [facility] [place] Office: [phone]

## 2023-11-17 NOTE — Anesthesia Procedure Notes (Signed)
 Procedure Name: Intubation Date/Time: 11/17/2023 8:13 AM  Performed by: Julien Manus, CRNAPre-anesthesia Checklist: Patient identified, Emergency Drugs available, Suction available and Patient being monitored Patient Re-evaluated:Patient Re-evaluated prior to induction Oxygen  Delivery Method: Circle System Utilized Preoxygenation: Pre-oxygenation with 100% oxygen  Induction Type: IV induction Ventilation: Mask ventilation without difficulty Laryngoscope Size: Mac and 4 Grade View: Grade I Tube type: Oral Tube size: 7.5 mm Number of attempts: 1 Airway Equipment and Method: Stylet and Oral airway Placement Confirmation: ETT inserted through vocal cords under direct vision, positive ETCO2 and breath sounds checked- equal and bilateral Secured at: 22 cm Tube secured with: Tape Dental Injury: Teeth and Oropharynx as per pre-operative assessment

## 2023-11-18 ENCOUNTER — Other Ambulatory Visit (HOSPITAL_COMMUNITY): Payer: Self-pay

## 2023-11-18 ENCOUNTER — Encounter (HOSPITAL_COMMUNITY): Payer: Self-pay | Admitting: Vascular Surgery

## 2023-11-18 LAB — BASIC METABOLIC PANEL WITH GFR
Anion gap: 8 (ref 5–15)
BUN: 10 mg/dL (ref 8–23)
CO2: 27 mmol/L (ref 22–32)
Calcium: 8.5 mg/dL — ABNORMAL LOW (ref 8.9–10.3)
Chloride: 101 mmol/L (ref 98–111)
Creatinine, Ser: 0.89 mg/dL (ref 0.61–1.24)
GFR, Estimated: >60 mL/min (ref >60)
Glucose, Bld: 135 mg/dL — ABNORMAL HIGH (ref 70–99)
Potassium: 4.2 mmol/L (ref 3.5–5.1)
Sodium: 136 mmol/L (ref 135–145)

## 2023-11-18 LAB — CBC
HCT: 33.7 % — ABNORMAL LOW (ref 39.0–52.0)
Hemoglobin: 11.6 g/dL — ABNORMAL LOW (ref 13.0–17.0)
MCH: 31.4 pg (ref 26.0–34.0)
MCHC: 34.4 g/dL (ref 30.0–36.0)
MCV: 91.1 fL (ref 80.0–100.0)
Platelets: 203 K/uL (ref 150–400)
RBC: 3.7 MIL/uL — ABNORMAL LOW (ref 4.22–5.81)
RDW: 12.5 % (ref 11.5–15.5)
WBC: 9.4 K/uL (ref 4.0–10.5)
nRBC: 0 % (ref 0.0–0.2)

## 2023-11-18 LAB — POCT ACTIVATED CLOTTING TIME
Activated Clotting Time: 239 s
Activated Clotting Time: 222 s

## 2023-11-18 MED ORDER — OXYCODONE HCL 5 MG PO TABS
5.0000 mg | ORAL_TABLET | Freq: Four times a day (QID) | ORAL | 0 refills | Status: DC | PRN
  Filled 2023-11-18: qty 6, 2d supply, fill #0

## 2023-11-18 MED ORDER — CARVEDILOL 12.5 MG PO TABS
12.5000 mg | ORAL_TABLET | Freq: Two times a day (BID) | ORAL | Status: DC
Start: 2023-11-18 — End: 2023-11-18

## 2023-11-18 MED ORDER — CLOPIDOGREL BISULFATE 75 MG PO TABS
75.0000 mg | ORAL_TABLET | Freq: Every day | ORAL | Status: DC
  Administered 2023-11-18: 75 mg via ORAL
  Filled 2023-11-18: qty 1

## 2023-11-18 MED ORDER — ASPIRIN 81 MG PO TBEC
81.0000 mg | DELAYED_RELEASE_TABLET | Freq: Every day | ORAL | Status: DC
  Administered 2023-11-18: 81 mg via ORAL
  Filled 2023-11-18: qty 1

## 2023-11-18 MED ORDER — CARVEDILOL 12.5 MG PO TABS
12.5000 mg | ORAL_TABLET | Freq: Two times a day (BID) | ORAL | 1 refills | Status: DC
  Filled 2023-11-18: qty 30, 15d supply, fill #0

## 2023-11-18 NOTE — Progress Notes (Addendum)
  Progress Note    11/18/2023 7:45 AM 1 Day Post-Op  Subjective:  no complaints, says he has walked up and down the hall without issue    Vitals:   11/18/23 0526 11/18/23 0700  BP: (!) 172/66   Pulse:    Resp:    Temp:  97.9 F (36.6 C)  SpO2:      Physical Exam: General:  sitting in the chair, alert and oriented x3 Lungs:  nonlabored Incisions:  bilateral groin cath sites intact and dry without hematoma Extremities:  brisk DP/PT doppler signals bilaterally  CBC    Component Value Date/Time   WBC 9.4 11/18/2023 0524   RBC 3.70 (L) 11/18/2023 0524   HGB 11.6 (L) 11/18/2023 0524   HCT 33.7 (L) 11/18/2023 0524   PLT 203 11/18/2023 0524   MCV 91.1 11/18/2023 0524   MCH 31.4 11/18/2023 0524   MCHC 34.4 11/18/2023 0524   RDW 12.5 11/18/2023 0524    BMET    Component Value Date/Time   NA 136 11/18/2023 0524   K 4.2 11/18/2023 0524   CL 101 11/18/2023 0524   CO2 27 11/18/2023 0524   GLUCOSE 135 (H) 11/18/2023 0524   BUN 10 11/18/2023 0524   CREATININE 0.89 11/18/2023 0524   CALCIUM  8.5 (L) 11/18/2023 0524   GFRNONAA >60 11/18/2023 0524    INR    Component Value Date/Time   INR 1.2 11/17/2023 1015     Intake/Output Summary (Last 24 hours) at 11/18/2023 0745 Last data filed at 11/18/2023 0700 Gross per 24 hour  Intake 1310.06 ml  Output 1850 ml  Net -539.94 ml      Assessment/Plan:  78 y.o. male is 1 day post op, s/p: EVAR   -He is doing well this morning without any complaints. He denies any pain -Bilateral groin cath sites are soft without hematoma -Bilateral lower extremities warm and well-perfused with brisk DP/PT Doppler signals -He denies any abdominal or back pain -He has walked up and down the halls without any difficulty or bleeding issues -Hemoglobin is stable at 11.6 -Serum creatinine stable at 0.89 with good UO of 1200cc since surgery -Will restart daily aspirin  and Plavix  today -Will remove Foley this morning and plan for discharge  once the patient has voided.  Will arrange follow-up in 4 weeks with Dr. Gretta with CTA abdomen/pelvis   Ahmed Holster, PA-C [facility] [phone] 11/18/2023 7:45 AM  I have seen and evaluated the patient. I agree with the PA note as documented above.  Postop day 1 status post EVAR for 5.5 cm abdominal aortic aneurysm.  Looks really good today.  Off oxygen .  Groins look good.  Brisk DP signals bilaterally.  Will DC Foley and A-line.  Mobilize and hope for discharge.  Follow-up in 1 month with CTA.  On aspirin  statin for risk reduction as we discussed.  Labs look good.  Lonni DOROTHA Gretta, MD [facility] [place] Office: [phone]

## 2023-11-18 NOTE — TOC Initial Note (Signed)
 Transition of Care [facility]) - Initial/Assessment Note    Patient Details  Name: Brian Ibarra MRN: [id number] Date of Birth: [birth year]  [facility] CM/SW Contact:    Justina Delcia Czar, RN Phone Number: [phone] 11/18/2023, 10:59 AM  Clinical Narrative:                 Spoke to pt and SO at bedside. Pt states he is independent at home. Spoke to his PCP and will see him in a week. Has RW and cane at home if needed for ambulation. SO will assist with getting him to appts.   Expected Discharge Plan: Home/Self Care Barriers to Discharge: No Barriers Identified   Patient Goals and CMS Choice Patient states their goals for this hospitalization and ongoing recovery are:: wants to recover          Expected Discharge Plan and Services   Discharge Planning Services: CM Consult   Living arrangements for the past 2 months: Single Family Home Expected Discharge Date: 11/18/23                                    Prior Living Arrangements/Services Living arrangements for the past 2 months: Single Family Home Lives with:: Significant Other Patient language and need for interpreter reviewed:: Yes Do you feel safe going back to the place where you live?: Yes      Need for Family Participation in Patient Care: No (Comment) Care giver support system in place?: No (comment) Current home services: DME (cane, rolling walker) Criminal Activity/Legal Involvement Pertinent to Current Situation/Hospitalization: No - Comment as needed  Activities of Daily Living   ADL Screening (condition at time of admission) Independently performs ADLs?: Yes (appropriate for developmental age) Is the patient deaf or have difficulty hearing?: Yes Does the patient have difficulty seeing, even when wearing glasses/contacts?: Yes Does the patient have difficulty concentrating, remembering, or making decisions?: No  Permission Sought/Granted Permission sought to share information with :  Case Manager, Family Supports, PCP Permission granted to share information with : Yes, Verbal Permission Granted              Emotional Assessment Appearance:: Appears stated age Attitude/Demeanor/Rapport: Engaged Affect (typically observed): Accepting Orientation: : Oriented to Self, Oriented to Place, Oriented to  Time, Oriented to Situation   Psych Involvement: No (comment)  Admission diagnosis:  Abdominal aortic aneurysm (AAA) without rupture, unspecified part (HCC) [I71.40] S/P AAA (abdominal aortic aneurysm) repair [S01.109, Z86.79] S/P AAA repair using bifurcation graft [S04.171, Z86.79] Patient Active Problem List   Diagnosis Date Noted   S/P AAA (abdominal aortic aneurysm) repair 11/17/2023   S/P AAA repair using bifurcation graft 11/17/2023   Nonobstructive CAD , calcium  score 175, CAD RADS 3 study, moderate stenosis of OM1, minimal stenosis LAD, CT FFR low likelihood of hemodynamically significant stenosis [facility]/28/2023 at [facility] 01/30/2023   Precordial pain 12/24/2022   Frequent PVCs 5.9% burden by event monitor 11-26-2022; runs of nonsustained ventricular tachycardia up to 18 beats lasting 8.8 seconds 12/24/2022   AAA (abdominal aortic aneurysm) (HCC)    Aneurysm of infrarenal abdominal aorta (HCC)    Aortic atherosclerosis (HCC)    Benign essential hypertension    BMI 28.0-28.9,adult    COPD (chronic obstructive pulmonary disease) (HCC)    COPD with emphysema (HCC)    ED (erectile dysfunction)    Heavy smoker    Hypertension  Intermittent palpitations    Mixed hyperlipidemia    Peripheral vascular disease of lower extremity (HCC)    AAA (abdominal aortic aneurysm) without rupture (HCC) 01/16/2021   Descending thoracic aortic aneurysm (HCC), ectatic associated with penetrating ulcer at the level of the carina 01/11/2020   Overgrown toenails 11/25/2018   PCP:  Fernand Tracey LABOR, MD Pharmacy:   [address] - [place], [place] - [address]  [address] [place] [address] Phone: [phone] Fax: [phone]  [facility] [address] [place] [address] Phone: [phone] Fax: [phone]     Social Drivers of Health (SDOH) Social History: SDOH Screenings   Food Insecurity: No Food Insecurity (11/17/2023)  Housing: Low Risk  (11/17/2023)  Transportation Needs: No Transportation Needs (11/17/2023)  Utilities: Not At Risk (11/17/2023)  Social Connections: Unknown (11/17/2023)  Tobacco Use: High Risk (11/17/2023)   SDOH Interventions:     Readmission Risk Interventions     No data to display

## 2023-11-18 NOTE — Discharge Summary (Signed)
 EVAR Discharge Summary   Brian Ibarra 1946/02/28 78 y.o. male  MRN: [id number]  Admission Date: 11/17/2023  Discharge Date: 11/18/2023  Physician: [facility]   Admission Diagnosis: Abdominal aortic aneurysm (AAA) without rupture, unspecified part (HCC) [I71.40] S/P AAA (abdominal aortic aneurysm) repair [S01.109, Z86.79] S/P AAA repair using bifurcation graft [S04.171, Z86.79]  Discharge Day Diagnosis: Abdominal aortic aneurysm (AAA) without rupture, unspecified part (HCC) [I71.40] S/P AAA (abdominal aortic aneurysm) repair [S01.109, Z86.79] S/P AAA repair using bifurcation graft [S04.171, Z86.79]  Hospital Course:  The patient was admitted to the hospital and taken to the operating room on 11/17/2023 and underwent: EVAR    The pt tolerated the procedure well and was transported to the PACU in excellent condition.   On POD1 he was doing well without any complaints. Bilateral groin cath sites were soft without hematoma. Bilateral lower extremities were well perfused with brisk DP/PT doppler signals. His serum creatinine was stable at 0.89 and he was voiding without difficulty. Hgb was stable at 11.6.  He was tolerating a normal diet and ambulating without difficulty. He was discharged home on POD1.  CBC    Component Value Date/Time   WBC 9.4 11/18/2023 0524   RBC 3.70 (L) 11/18/2023 0524   HGB 11.6 (L) 11/18/2023 0524   HCT 33.7 (L) 11/18/2023 0524   PLT 203 11/18/2023 0524   MCV 91.1 11/18/2023 0524   MCH 31.4 11/18/2023 0524   MCHC 34.4 11/18/2023 0524   RDW 12.5 11/18/2023 0524    BMET    Component Value Date/Time   NA 136 11/18/2023 0524   K 4.2 11/18/2023 0524   CL 101 11/18/2023 0524   CO2 27 11/18/2023 0524   GLUCOSE 135 (H) 11/18/2023 0524   BUN 10 11/18/2023 0524   CREATININE 0.89 11/18/2023 0524   CALCIUM  8.5 (L) 11/18/2023 0524   GFRNONAA >60 11/18/2023 0524       Discharge Instructions     Call MD for:  redness, tenderness, or signs of  infection (pain, swelling, redness, odor or green/yellow discharge around incision site)   Complete by: As directed    Call MD for:  severe uncontrolled pain   Complete by: As directed    Call MD for:  temperature >100.4   Complete by: As directed    Diet - low sodium heart healthy   Complete by: As directed    Discharge wound care:   Complete by: As directed    Keep groin incisions clean and dry   Increase activity slowly   Complete by: As directed        Discharge Diagnosis:  Abdominal aortic aneurysm (AAA) without rupture, unspecified part (HCC) [I71.40] S/P AAA (abdominal aortic aneurysm) repair [S01.109, Z86.79] S/P AAA repair using bifurcation graft [S04.171, Z86.79]  Secondary Diagnosis: Patient Active Problem List   Diagnosis Date Noted   S/P AAA (abdominal aortic aneurysm) repair 11/17/2023   S/P AAA repair using bifurcation graft 11/17/2023   Nonobstructive CAD , calcium  score 175, CAD RADS 3 study, moderate stenosis of OM1, minimal stenosis LAD, CT FFR low likelihood of hemodynamically significant stenosis 01/28/2023 at [facility] 01/30/2023   Precordial pain 12/24/2022   Frequent PVCs 5.9% burden by event monitor 11-26-2022; runs of nonsustained ventricular tachycardia up to 18 beats lasting 8.8 seconds 12/24/2022   AAA (abdominal aortic aneurysm) (HCC)    Aneurysm of infrarenal abdominal aorta (HCC)    Aortic atherosclerosis (HCC)    Benign essential hypertension    BMI 28.0-28.9,adult  COPD (chronic obstructive pulmonary disease) (HCC)    COPD with emphysema (HCC)    ED (erectile dysfunction)    Heavy smoker    Hypertension    Intermittent palpitations    Mixed hyperlipidemia    Peripheral vascular disease of lower extremity (HCC)    AAA (abdominal aortic aneurysm) without rupture (HCC) 01/16/2021   Descending thoracic aortic aneurysm (HCC), ectatic associated with penetrating ulcer at the level of the carina 01/11/2020   Overgrown toenails 11/25/2018   Past  Medical History:  Diagnosis Date   AAA (abdominal aortic aneurysm) (HCC)    being monitored by vascular MD, currently 4.5cm   AAA (abdominal aortic aneurysm) without rupture (HCC) 01/16/2021   Aneurysm of infrarenal abdominal aorta (HCC)    Aortic atherosclerosis (HCC)    Benign essential hypertension    BMI 28.0-28.9,adult    COPD (chronic obstructive pulmonary disease) (HCC)    COPD with emphysema (HCC)    Descending thoracic aortic aneurysm (HCC)    4.3cm, monitored yearly by vascular   ED (erectile dysfunction)    Frequent PVCs 5.9% burden by event monitor 11-26-2022; runs of nonsustained ventricular tachycardia up to 18 beats lasting 8.8 seconds 12/24/2022   Heavy smoker    smokes 1-11/2 ppd   Hypertension    Intermittent palpitations    Mixed hyperlipidemia    Overgrown toenails 11/25/2018   Peripheral vascular disease of lower extremity (HCC)    Precordial pain 12/24/2022     Allergies as of 11/18/2023   No Known Allergies      Medication List     TAKE these medications    albuterol  108 (90 Base) MCG/ACT inhaler Commonly known as: VENTOLIN  HFA Inhale 2 puffs into the lungs daily.   amLODipine -olmesartan  10-40 MG tablet Commonly known as: AZOR  Take 1 tablet by mouth daily.   aspirin  EC 81 MG tablet Take 81 mg by mouth daily.   atorvastatin  40 MG tablet Commonly known as: LIPITOR Take 40 mg by mouth daily.   carvedilol  12.5 MG tablet Commonly known as: COREG  Take 1 tablet (12.5 mg total) by mouth 2 (two) times daily with a meal. What changed:  medication strength how much to take   clopidogrel  75 MG tablet Commonly known as: PLAVIX  Take 75 mg by mouth daily.   doxazosin  1 MG tablet Commonly known as: CARDURA  Take 1 mg by mouth at bedtime.   fluticasone  furoate-vilanterol 100-25 MCG/ACT Aepb Commonly known as: BREO ELLIPTA  Inhale 2 puffs into the lungs daily.   hydrochlorothiazide  25 MG tablet Commonly known as: HYDRODIURIL  Take 1 tablet (25 mg  total) by mouth daily.   metoprolol  tartrate 25 MG tablet Commonly known as: LOPRESSOR  Take 1 tablet (25 mg total) by mouth 2 (two) times daily.   oxyCODONE  5 MG immediate release tablet Commonly known as: Roxicodone  Take 1 tablet (5 mg total) by mouth every 6 (six) hours as needed for severe pain (pain score 7-10).   Stiolto Respimat 2.5-2.5 MCG/ACT Aers Generic drug: Tiotropium Bromide-Olodaterol Inhale 2 puffs into the lungs daily.   testosterone  cypionate 200 MG/ML injection Commonly known as: DEPOTESTOSTERONE CYPIONATE Inject 200 mg into the muscle every 14 (fourteen) days. Haven't started   traZODone  50 MG tablet Commonly known as: DESYREL  Take 100 mg by mouth at bedtime as needed.               Discharge Care Instructions  (From admission, onward)           Start  Ordered   11/18/23 0000  Discharge wound care:       Comments: Keep groin incisions clean and dry   11/18/23 0751            Discharge Instructions:   [facility]  Discharge Instructions Endovascular Aortic Aneurysm Repair  Please refer to the following instructions for your post-procedure care. Your surgeon or Physician Assistant will discuss any changes with you.  Activity  You are encouraged to walk as much as you can. You can slowly return to normal activities but must avoid strenuous activity and heavy lifting until your doctor tells you it's OK. Avoid activities such as vacuuming or swinging a gold club. It is normal to feel tired for several weeks after your surgery. Do not drive until your doctor gives the OK and you are no longer taking prescription pain medications. It is also normal to have difficulty with sleep habits, eating, and bowel movements after surgery. These will go away with time.  Bathing/Showering  You may shower after you go home. If you have an incision, do not soak in a bathtub, hot tub, or swim until the incision heals  completely.  Incision Care  Shower every day. Clean your incision with mild soap and water. Pat the area dry with a clean towel. You do not need a bandage unless otherwise instructed. Do not apply any ointments or creams to your incision. If you clothing is irritating, you may cover your incision with a dry gauze pad.  Diet  Resume your normal diet. There are no special food restrictions following this procedure. A low fat/low cholesterol diet is recommended for all patients with vascular disease. In order to heal from your surgery, it is CRITICAL to get adequate nutrition. Your body requires vitamins, minerals, and protein. Vegetables are the best source of vitamins and minerals. Vegetables also provide the perfect balance of protein. Processed food has little nutritional value, so try to avoid this.  Medications  Resume taking all of your medications unless your doctor or Physician Assistant tells you not to. If your incision is causing pain, you may take over-the-counter pain relievers such as acetaminophen  (Tylenol ). If you were prescribed a stronger pain medication, please be aware these medications can cause nausea and constipation. Prevent nausea by taking the medication with a snack or meal. Avoid constipation by drinking plenty of fluids and eating foods with a high amount of fiber, such as fruits, vegetables, and grains. Do not take Tylenol  if you are taking prescription pain medications.   Follow up  Our office will schedule a follow-up appointment with a C.T. scan 3-4 weeks after your surgery.  Please call us  immediately for any of the following conditions  Severe or worsening pain in your legs or feet or in your abdomen back or chest. Increased pain, redness, drainage (pus) from your incision sit. Increased abdominal pain, bloating, nausea, vomiting or persistent diarrhea. Fever of 101 degrees or higher. Swelling in your leg (s),  Reduce your risk of vascular disease  Stop  smoking. If you would like help call [facility] at 1-800-QUIT-NOW (276-223-9298) or [place] at [phone]. Manage your cholesterol Maintain a desired weight Control your diabetes Keep your blood pressure down  If you have questions, please call the office at [phone].   Disposition: Home  Patient's condition: is Excellent  Follow up: 1. Dr. Gretta in 4 weeks with CTA protocol   Ahmed Holster, PA-C [facility] [phone] 11/18/2023  2:05  PM   - For [facility] Registry use - Post-op:  Time to Extubation: [x]  In OR, [ ]  < 12 hrs, [ ]  12-24 hrs, [ ]  >=24 hrs Vasopressors Req. Post-op: No MI: No., [ ]  Troponin only, [ ]  EKG or Clinical New Arrhythmia: No CHF: No ICU Stay: 0 day Transfusion: No     If yes,  units given  Complications: Resp failure: No., [ ]  Pneumonia, [ ]  Ventilator Chg in renal function: No., [ ]  Inc. Cr > 0.5, [ ]  Temp. Dialysis,  [ ]  Permanent dialysis Leg ischemia: No., no Surgery needed, [ ]  Yes, Surgery needed,  [ ]  Amputation Bowel ischemia: No., [ ]  Medical Rx, [ ]  Surgical Rx Wound complication: No., [ ]  Superficial separation/infection, [ ]  Return to OR Return to OR: No  Return to OR for bleeding: No Stroke: No., [ ]  Minor, [ ]  Major  Discharge medications: Statin use:  Yes  ASA use:  Yes  Plavix  use:  Yes  Beta blocker use:  Yes  ARB use:  Yes ACEI use:  No CCB use:  No

## 2023-11-19 ENCOUNTER — Other Ambulatory Visit: Payer: Self-pay

## 2023-11-19 DIAGNOSIS — I7143 Infrarenal abdominal aortic aneurysm, without rupture: Secondary | ICD-10-CM

## 2023-11-20 DIAGNOSIS — J441 Chronic obstructive pulmonary disease with (acute) exacerbation: Secondary | ICD-10-CM | POA: Diagnosis not present

## 2023-11-20 DIAGNOSIS — Z720 Tobacco use: Secondary | ICD-10-CM | POA: Diagnosis not present

## 2023-11-25 DIAGNOSIS — I10 Essential (primary) hypertension: Secondary | ICD-10-CM | POA: Diagnosis not present

## 2023-11-25 DIAGNOSIS — J439 Emphysema, unspecified: Secondary | ICD-10-CM | POA: Diagnosis not present

## 2023-11-25 DIAGNOSIS — Z6825 Body mass index (BMI) 25.0-25.9, adult: Secondary | ICD-10-CM | POA: Diagnosis not present

## 2023-11-25 DIAGNOSIS — D649 Anemia, unspecified: Secondary | ICD-10-CM | POA: Diagnosis not present

## 2023-11-25 DIAGNOSIS — G4734 Idiopathic sleep related nonobstructive alveolar hypoventilation: Secondary | ICD-10-CM | POA: Diagnosis not present

## 2023-11-25 DIAGNOSIS — I739 Peripheral vascular disease, unspecified: Secondary | ICD-10-CM | POA: Diagnosis not present

## 2023-11-25 DIAGNOSIS — I493 Ventricular premature depolarization: Secondary | ICD-10-CM | POA: Diagnosis not present

## 2023-12-18 ENCOUNTER — Ambulatory Visit (INDEPENDENT_AMBULATORY_CARE_PROVIDER_SITE_OTHER)
Admission: RE | Admit: 2023-12-18 | Discharge: 2023-12-18 | Disposition: A | Discharge status: Home / Self Care | Source: Ambulatory Visit | Attending: Vascular Surgery | Admitting: Vascular Surgery

## 2023-12-18 DIAGNOSIS — I7143 Infrarenal abdominal aortic aneurysm, without rupture: Secondary | ICD-10-CM | POA: Diagnosis not present

## 2023-12-18 DIAGNOSIS — N281 Cyst of kidney, acquired: Secondary | ICD-10-CM | POA: Diagnosis not present

## 2023-12-18 DIAGNOSIS — N400 Enlarged prostate without lower urinary tract symptoms: Secondary | ICD-10-CM | POA: Diagnosis not present

## 2023-12-18 DIAGNOSIS — I714 Abdominal aortic aneurysm, without rupture, unspecified: Secondary | ICD-10-CM | POA: Diagnosis not present

## 2023-12-18 DIAGNOSIS — K5730 Diverticulosis of large intestine without perforation or abscess without bleeding: Secondary | ICD-10-CM | POA: Diagnosis not present

## 2023-12-18 MED ORDER — IOHEXOL 350 MG/ML SOLN
100.0000 mL | Freq: Once | INTRAVENOUS | Status: AC | PRN
Start: 2023-12-18 — End: 2023-12-18
  Administered 2023-12-18: 100 mL via INTRAVENOUS

## 2023-12-21 DIAGNOSIS — J441 Chronic obstructive pulmonary disease with (acute) exacerbation: Secondary | ICD-10-CM | POA: Diagnosis not present

## 2023-12-21 DIAGNOSIS — Z720 Tobacco use: Secondary | ICD-10-CM | POA: Diagnosis not present

## 2023-12-22 NOTE — Progress Notes (Unsigned)
 Patient name: Brian Ibarra MRN: [id number] DOB: 08-26-1945 Sex: male  REASON FOR CONSULT: Postop status post EVAR  HPI: Brian Ibarra is a 78 y.o. male, with history of COPD, hypertension, tobacco abuse that presents for follow-up after recent EVAR for repair of his abdominal aortic aneurysm.  On 11/17/2023 he underwent EVAR for a 5.5 cm AAA.  We are also following a 4.5 cm descending thoracic aneurysm.  Past Medical History:  Diagnosis Date   AAA (abdominal aortic aneurysm)    being monitored by vascular MD, currently 4.5cm   AAA (abdominal aortic aneurysm) without rupture 01/16/2021   Aneurysm of infrarenal abdominal aorta    Aortic atherosclerosis    Benign essential hypertension    BMI 28.0-28.9,adult    COPD (chronic obstructive pulmonary disease) (HCC)    COPD with emphysema (HCC)    Descending thoracic aortic aneurysm    4.3cm, monitored yearly by vascular   ED (erectile dysfunction)    Frequent PVCs 5.9% burden by event monitor 11-26-2022; runs of nonsustained ventricular tachycardia up to 18 beats lasting 8.8 seconds 12/24/2022   Heavy smoker    smokes 1-11/2 ppd   Hypertension    Intermittent palpitations    Mixed hyperlipidemia    Overgrown toenails 11/25/2018   Peripheral vascular disease of lower extremity    Precordial pain 12/24/2022    Past Surgical History:  Procedure Laterality Date   ABDOMINAL AORTIC ENDOVASCULAR STENT GRAFT Bilateral 11/17/2023   Procedure: INSERTION, ENDOVASCULAR STENT GRAFT, AORTA, ABDOMINAL;  Surgeon: Gretta Lonni PARAS, MD;  Location: [facility] OR;  Service: Vascular;  Laterality: Bilateral;   COLONOSCOPY     DUPUYTREN CONTRACTURE RELEASE Right 04/05/2021   Procedure: EXCISION DUPUYTREN'S CONTRACTURES RIGHT HAND, LONG FINGER AND THUMB/INDEX WEBSPACE;  Surgeon: Murrell Drivers, MD;  Location: [facility];  Service: Orthopedics;  Laterality: Right;   HAND SURGERY Bilateral    IR RADIOLOGIST EVAL & MGMT  11/06/2021   TOTAL HIP  ARTHROPLASTY Left 2024   ULTRASOUND GUIDANCE FOR VASCULAR ACCESS Bilateral 11/17/2023   Procedure: ULTRASOUND GUIDANCE, FOR VASCULAR ACCESS;  Surgeon: Gretta Lonni PARAS, MD;  Location[facility];  Service: Vascular;  Laterality: Bilateral;    Family History  Problem Relation Age of Onset   Heart disease Mother    Cancer Mother    Cancer Father     SOCIAL HISTORY: Social History   Socioeconomic History   Marital status: Divorced    Spouse name: Not on file   Number of children: Not on file   Years of education: Not on file   Highest education level: Not on file  Occupational History   Not on file  Tobacco Use   Smoking status: Every Day    Current packs/day: 1.50    Types: Cigarettes   Smokeless tobacco: Former  Building services engineer status: Never Used  Substance and Sexual Activity   Alcohol use: Yes    Alcohol/week: 7.0 standard drinks of alcohol    Types: 7 Shots of liquor per week    Comment: drinks 1 burbon nightly   Drug use: Never   Sexual activity: Yes  Other Topics Concern   Not on file  Social History Narrative   Not on file   Social Drivers of Health   Financial Resource Strain: Not on file  Food Insecurity: No Food Insecurity (11/17/2023)   Hunger Vital Sign    Worried About Running Out of Food in the Last Year: Never true  Ran Out of Food in the Last Year: Never true  Transportation Needs: No Transportation Needs (11/17/2023)   PRAPARE - Administrator, Civil Service (Medical): No    Lack of Transportation (Non-Medical): No  Physical Activity: Not on file  Stress: Not on file  Social Connections: Unknown (11/17/2023)   Social Connection and Isolation Panel    Frequency of Communication with Friends and Family: Patient unable to answer    Frequency of Social Gatherings with Friends and Family: Once a week    Attends Religious Services: Never    Database administrator or Organizations: No    Attends Banker Meetings: Never     Marital Status: Living with partner  Intimate Partner Violence: Not At Risk (11/17/2023)   Humiliation, Afraid, Rape, and Kick questionnaire    Fear of Current or Ex-Partner: No    Emotionally Abused: No    Physically Abused: No    Sexually Abused: No    No Known Allergies  Current Outpatient Medications  Medication Sig Dispense Refill   albuterol  (VENTOLIN  HFA) 108 (90 Base) MCG/ACT inhaler Inhale 2 puffs into the lungs daily.     amLODipine -olmesartan  (AZOR ) 10-40 MG tablet Take 1 tablet by mouth daily.     aspirin  81 MG EC tablet Take 81 mg by mouth daily.     atorvastatin  (LIPITOR) 40 MG tablet Take 40 mg by mouth daily.     carvedilol  (COREG ) 12.5 MG tablet Take 1 tablet (12.5 mg total) by mouth 2 (two) times daily with a meal. 30 tablet 1   clopidogrel  (PLAVIX ) 75 MG tablet Take 75 mg by mouth daily.     doxazosin  (CARDURA ) 1 MG tablet Take 1 mg by mouth at bedtime.     fluticasone  furoate-vilanterol (BREO ELLIPTA ) 100-25 MCG/ACT AEPB Inhale 2 puffs into the lungs daily.     hydrochlorothiazide  (HYDRODIURIL ) 25 MG tablet Take 1 tablet (25 mg total) by mouth daily. 90 tablet 3   metoprolol  tartrate (LOPRESSOR ) 25 MG tablet Take 1 tablet (25 mg total) by mouth 2 (two) times daily. (Patient not taking: Reported on 11/17/2023) 180 tablet 3   oxyCODONE  (ROXICODONE ) 5 MG immediate release tablet Take 1 tablet (5 mg total) by mouth every 6 (six) hours as needed for severe pain (pain score 7-10). 6 tablet 0   STIOLTO RESPIMAT 2.5-2.5 MCG/ACT AERS Inhale 2 puffs into the lungs daily.     testosterone  cypionate (DEPOTESTOSTERONE CYPIONATE) 200 MG/ML injection Inject 200 mg into the muscle every 14 (fourteen) days. Haven't started (Patient not taking: Reported on 11/17/2023)     traZODone  (DESYREL ) 50 MG tablet Take 100 mg by mouth at bedtime as needed.     No current facility-administered medications for this visit.    REVIEW OF SYSTEMS:  [X]  denotes positive finding, [ ]  denotes negative  finding Cardiac  Comments:  Chest pain or chest pressure: ***   Shortness of breath upon exertion:    Short of breath when lying flat:    Irregular heart rhythm:        Vascular    Pain in calf, thigh, or hip brought on by ambulation:    Pain in feet at night that wakes you up from your sleep:     Blood clot in your veins:    Leg swelling:         Pulmonary    Oxygen  at home:    Productive cough:     Wheezing:  Neurologic    Sudden weakness in arms or legs:     Sudden numbness in arms or legs:     Sudden onset of difficulty speaking or slurred speech:    Temporary loss of vision in one eye:     Problems with dizziness:         Gastrointestinal    Blood in stool:     Vomited blood:         Genitourinary    Burning when urinating:     Blood in urine:        Psychiatric    Major depression:         Hematologic    Bleeding problems:    Problems with blood clotting too easily:        Skin    Rashes or ulcers:        Constitutional    Fever or chills:      PHYSICAL EXAM: There were no vitals filed for this visit.  GENERAL: The patient is a well-nourished male, in no acute distress. The vital signs are documented above. CARDIAC: There is a regular rate and rhythm.  VASCULAR: *** PULMONARY: There is good air exchange bilaterally without wheezing or rales. ABDOMEN: Soft and non-tender with normal pitched bowel sounds.  MUSCULOSKELETAL: There are no major deformities or cyanosis. NEUROLOGIC: No focal weakness or paresthesias are detected. SKIN: There are no ulcers or rashes noted. PSYCHIATRIC: The patient has a normal affect.  DATA:   CTA abdomen pelvis 12/18/2023 shows good position of the stent graft with exclusion of the aneurysm and a type II endoleak  Assessment/Plan:   78 y.o. male, with history of COPD, hypertension, tobacco abuse that presents for follow-up after recent EVAR for repair of his abdominal aortic aneurysm.  On 11/17/2023 he underwent  EVAR for a 5.5 cm AAA.  Discussed the stent graft is in good position.  There is appropriate exclusion of the aneurysm.  Appears to have a type II endoleak.  Discussed we will monitor this on future surveillance.  Only if his aneurysm sac starts growing with this require future intervention.   Lonni DOROTHA Gaskins, MD [facility] [place] Office: [phone]

## 2023-12-23 ENCOUNTER — Ambulatory Visit: Attending: Vascular Surgery | Admitting: Vascular Surgery

## 2023-12-23 ENCOUNTER — Encounter: Payer: Self-pay | Admitting: Vascular Surgery

## 2023-12-23 VITALS — BP 105/59 | HR 85 | Temp 98.2°F | Resp 22 | Ht 74.0 in | Wt 198.0 lb

## 2023-12-23 DIAGNOSIS — I7143 Infrarenal abdominal aortic aneurysm, without rupture: Secondary | ICD-10-CM

## 2023-12-25 ENCOUNTER — Other Ambulatory Visit: Payer: Self-pay | Admitting: *Deleted

## 2023-12-25 DIAGNOSIS — Z0000 Encounter for general adult medical examination without abnormal findings: Secondary | ICD-10-CM | POA: Diagnosis not present

## 2023-12-25 DIAGNOSIS — Z23 Encounter for immunization: Secondary | ICD-10-CM | POA: Diagnosis not present

## 2023-12-25 DIAGNOSIS — Z9181 History of falling: Secondary | ICD-10-CM | POA: Diagnosis not present

## 2023-12-25 DIAGNOSIS — Z1339 Encounter for screening examination for other mental health and behavioral disorders: Secondary | ICD-10-CM | POA: Diagnosis not present

## 2023-12-25 DIAGNOSIS — J439 Emphysema, unspecified: Secondary | ICD-10-CM | POA: Diagnosis not present

## 2023-12-25 DIAGNOSIS — Z1331 Encounter for screening for depression: Secondary | ICD-10-CM | POA: Diagnosis not present

## 2023-12-25 DIAGNOSIS — Z8679 Personal history of other diseases of the circulatory system: Secondary | ICD-10-CM

## 2023-12-25 DIAGNOSIS — I10 Essential (primary) hypertension: Secondary | ICD-10-CM | POA: Diagnosis not present

## 2023-12-25 DIAGNOSIS — I951 Orthostatic hypotension: Secondary | ICD-10-CM | POA: Diagnosis not present

## 2023-12-25 DIAGNOSIS — Z6824 Body mass index (BMI) 24.0-24.9, adult: Secondary | ICD-10-CM | POA: Diagnosis not present

## 2024-01-19 ENCOUNTER — Other Ambulatory Visit: Payer: Self-pay

## 2024-01-20 ENCOUNTER — Ambulatory Visit

## 2024-01-20 VITALS — BP 128/74 | HR 73 | Ht 74.0 in | Wt 194.4 lb

## 2024-01-20 DIAGNOSIS — I2510 Atherosclerotic heart disease of native coronary artery without angina pectoris: Secondary | ICD-10-CM

## 2024-01-20 DIAGNOSIS — I493 Ventricular premature depolarization: Secondary | ICD-10-CM | POA: Diagnosis not present

## 2024-01-20 DIAGNOSIS — I10 Essential (primary) hypertension: Secondary | ICD-10-CM

## 2024-01-20 DIAGNOSIS — J449 Chronic obstructive pulmonary disease, unspecified: Secondary | ICD-10-CM | POA: Diagnosis not present

## 2024-01-20 MED ORDER — CARVEDILOL 3.125 MG PO TABS: 3.1250 mg | ORAL_TABLET | Freq: Two times a day (BID) | ORAL | 3 refills | Status: AC

## 2024-01-20 NOTE — Assessment & Plan Note (Signed)
 Asymptomatic. No significant cardiac structural or functional issues and no significant obstructive CAD. Continue with carvedilol  3.125 mg once daily as discussed above under hypertension.

## 2024-01-20 NOTE — Assessment & Plan Note (Addendum)
 Well-controlled today in the clinic. Target blood pressure below 130/80 mmHg. He does have postural lightheadedness at times but has not sustained any falls or syncopal episodes related to this.  High risk for vascular complications given his aortic aneurysm, if blood pressures remain markedly elevated.  Continue amlodipine -olmesartan  10-40 mg once daily Continue/resume carvedilol  3.125 mg twice daily [there is slight increased risk for COPD deterioration, however given the risk benefits and relatively stable pulmonary status and previously well-tolerated, would prefer to continue carvedilol ]. Unclear if he is currently taking hydrochlorothiazide  at home. Similarly unclear if he is currently taking doxazosin  [mentions he discontinued this 2 weeks ago.  Advised him to keep an accurate log of his active current medications at home by preparing a list I will bring the bottles to the next visit with us .  Advised him to maintain a blood pressure log for the next 2 weeks and send it to us  on MyChart.

## 2024-01-20 NOTE — Patient Instructions (Signed)
 Please keep a BP log for 2 weeks and send by MyChart or mail.                        Dr. Liborio [address] [place], [place] [address]  Blood Pressure Record Sheet To take your blood pressure, you will need a blood pressure machine. You can buy a blood pressure machine (blood pressure monitor) at your clinic, drug store, or online. When choosing one, consider: An automatic monitor that has an arm cuff. A cuff that wraps snugly around your upper arm. You should be able to fit only one finger between your arm and the cuff. A device that stores blood pressure reading results. Do not choose a monitor that measures your blood pressure from your wrist or finger. Follow your health care provider's instructions for how to take your blood pressure. To use this form: Get one reading in the morning (a.m.) 1-2 hours after you take any medicines. Get one reading in the evening (p.m.) before supper.   Blood pressure log Date: _______________________  a.m. _____________________(1st reading) HR___________            p.m. _____________________(2nd reading) HR__________  Date: _______________________  a.m. _____________________(1st reading) HR___________            p.m. _____________________(2nd reading) HR__________  Date: _______________________  a.m. _____________________(1st reading) HR___________            p.m. _____________________(2nd reading) HR__________  Date: _______________________  a.m. _____________________(1st reading) HR___________            p.m. _____________________(2nd reading) HR__________  Date: _______________________  a.m. _____________________(1st reading) HR___________            p.m. _____________________(2nd reading) HR__________  Date: _______________________  a.m. _____________________(1st reading) HR___________            p.m. _____________________(2nd reading) HR__________  Date: _______________________  a.m. _____________________(1st reading)  HR___________            p.m. _____________________(2nd reading) HR__________   This information is not intended to replace advice given to you by your health care provider. Make sure you discuss any questions you have with your health care provider. Document Revised: 06/30/2019 Document Reviewed: 06/30/2019 Elsevier Patient Education  2021 Elsevier Inc.    Medication Instructions:  Your physician has recommended you make the following change in your medication:   Start Carvedilol  3.125 mg twice daily  *If you need a refill on your cardiac medications before your next appointment, please call your pharmacy*   Lab Work: None ordered If you have labs (blood work) drawn today and your tests are completely normal, you will receive your results only by: MyChart Message (if you have MyChart) OR A paper copy in the mail If you have any lab test that is abnormal or we need to change your treatment, we will call you to review the results.   Testing/Procedures: None ordered   Follow-Up: At [facility], you and your health needs are our priority.  As part of our continuing mission to provide you with exceptional heart care, we have created designated Provider Care Teams.  These Care Teams include your primary Cardiologist (physician) and Advanced Practice Providers (APPs -  Physician Assistants and Nurse Practitioners) who all work together to provide you with the care you need, when you need it.  We recommend signing up for the patient portal called MyChart.  Sign up information is provided on this After Visit Summary.  MyChart is  used to connect with patients for Virtual Visits (Telemedicine).  Patients are able to view lab/test results, encounter notes, upcoming appointments, etc.  Non-urgent messages can be sent to your provider as well.   To learn more about what you can do with MyChart, go to [email or web address].    Your next appointment:   6 month(s)  The format  for your next appointment:   In Person  Provider:   Alean Kobus, MD    Other Instructions none  Important Information About Sugar

## 2024-01-20 NOTE — Progress Notes (Signed)
 Cardiology Consultation:    Date:  01/20/2024   ID:  Brian Ibarra, DOB 1945-12-04, MRN [id number]  PCP:  Fernand Tracey LABOR, MD  Cardiologist:  Alean SAUNDERS Candis Kabel, MD   Referring MD: Fernand Tracey LABOR, MD   No chief complaint on file.    ASSESSMENT AND PLAN:   Brian Ibarra 78 year old male with history of HTN, PAD s/p angioplasty of femoral arteries in 2017, COPD now on nocturnal O2, ongoing tobacco  abuse, hyperlipidemia, infrarenal AAA [s/p EVAR  11/17/2023],  descending thoracic aorta dilatation 4.2 cm [CTA chest July 2025; stable in comparison to imaging from October 2023], daily alcohol use, CAD (CAD RADS 3, not significant by CT FFR) CT coronary imaging from 01/29/2023.  Echocardiogram 01/29/2023 with normal biventricular function EF 60 to 65%. Also has history of frequent ventricular ectopy burden up to 6% on prior heart monitor, nonsustained ventricular tachycardia runs up to 18 beats lasting 8.8 seconds. S/p recent fall and left hip fracture surgery 2024. Has family history of aortic aneurysm with his younger sister recently diagnosed.   Problem List Items Addressed This Visit     Benign essential hypertension   Well-controlled today in the clinic. Target blood pressure below 130/80 mmHg. He does have postural lightheadedness at times but has not sustained any falls or syncopal episodes related to this.  High risk for vascular complications given his aortic aneurysm, if blood pressures remain markedly elevated.  Continue amlodipine -olmesartan  10-40 mg once daily Continue/resume carvedilol  3.125 mg twice daily [there is slight increased risk for COPD deterioration, however given the risk benefits and relatively stable pulmonary status and previously well-tolerated, would prefer to continue carvedilol ]. Unclear if he is currently taking hydrochlorothiazide  at home. Similarly unclear if he is currently taking doxazosin  [mentions he discontinued this 2 weeks ago.  Advised him to keep an  accurate log of his active current medications at home by preparing a list I will bring the bottles to the next visit with us .  Advised him to maintain a blood pressure log for the next 2 weeks and send it to us  on MyChart.      Relevant Medications   carvedilol  (COREG ) 3.125 MG tablet   Frequent PVCs 5.9% burden by event monitor 11-26-2022; runs of nonsustained ventricular tachycardia up to 18 beats lasting 8.8 seconds   Asymptomatic. No significant cardiac structural or functional issues and no significant obstructive CAD. Continue with carvedilol  3.125 mg once daily as discussed above under hypertension.       Relevant Medications   carvedilol  (COREG ) 3.125 MG tablet   Nonobstructive CAD , calcium  score 175, CAD RADS 3 study, moderate stenosis of OM1, minimal stenosis LAD, CT FFR low likelihood of hemodynamically significant stenosis 01/28/2023 at [facility]   Remains on Plavix  75 mg once daily given his peripheral vascular disease history. Continue atorvastatin  40 mg once daily.       Relevant Medications   carvedilol  (COREG ) 3.125 MG tablet   Continue following up closely with vascular surgeon as currently scheduled with regards to his thoracic and abdominal aortic aneurysms.  Return to clinic tentatively in 6 months   History of Present Illness:    Brian Ibarra is a 78 y.o. male who is being seen today for follow-up visit. PCP Fernand Tracey LABOR, MD. Last visit with me in the office was 01/30/2023.  history of hypertension, peripheral vascular disease s/p angioplasty of femoral arteries in 2017, COPD now on nocturnal O2, ongoing tobacco and alcohol abuse, hyperlipidemia,  infrarenal abdominal aortic aneurysm [s/p EVAR  11/17/2023],  descending thoracic aorta dilatation 4.2 cm [CTA chest July 2025; stable in comparison to imaging from October 2023], Daily alcohol use, coronary atherosclerosis on CT coronary imaging from 01/29/2023.  Echocardiogram 01/29/2023 with normal  biventricular function EF 60 to 65%. Also has history of frequent ventricular ectopy burden up to 6% on prior heart monitor, nonsustained ventricular tachycardia runs up to 18 beats lasting 8.8 seconds. S/p recent fall and left hip fracture surgery 2024. Has family history of aortic aneurysm with his younger sister recently diagnosed.  Denies any chest pain or shortness of breath. Denies any syncopal episodes. Denies any palpitations. Does report getting lightheaded on suddenly changing position and walking. No significant symptoms on gradually changing position and ambulating.  At follow-up visit with vascular surgeon Dr. Gretta hydrochlorothiazide  was recommended to be held. Here in the office today mentions his blood pressures over the past week have been somewhat elevated. Per my list here he had been on olmesartan -amlodipine  combination pill, carvedilol , hydrochlorothiazide , doxazosin . He mentions doxazosin  was discontinued.  He was unclear if he stopped carvedilol  or hydrochlorothiazide  and mentions he was mostly taking hydrochlorothiazide .  Continues to smoke a pack of cigarettes a day. Drinks one fourth drink of bourbon with coke a day. No illicit drug use.  Now with recent diagnosis of his sister Brian Ibarra smokes cigarettes] with aneurysm, that makes 2 immediate family members with aortic aneurysm.  Recommended he notify his other siblings and his 2 adult children [ages 79 and 43].  Past Medical History:  Diagnosis Date   AAA (abdominal aortic aneurysm)    being monitored by vascular MD, currently 4.5cm   AAA (abdominal aortic aneurysm) without rupture 01/16/2021   Aneurysm of infrarenal abdominal aorta    Aortic atherosclerosis    Benign essential hypertension    BMI 28.0-28.9,adult    COPD (chronic obstructive pulmonary disease) (HCC)    COPD with emphysema (HCC)    Descending thoracic aortic aneurysm    4.3cm, monitored yearly by vascular   ED (erectile dysfunction)     Frequent PVCs 5.9% burden by event monitor 11-26-2022; runs of nonsustained ventricular tachycardia up to 18 beats lasting 8.8 seconds 12/24/2022   Heavy smoker    smokes 1-11/2 ppd   Hypertension    Intermittent palpitations    Mixed hyperlipidemia    Nonobstructive CAD , calcium  score 175, CAD RADS 3 study, moderate stenosis of OM1, minimal stenosis LAD, CT FFR low likelihood of hemodynamically significant stenosis 01/28/2023 at [facility] 01/30/2023   Overgrown toenails 11/25/2018   Peripheral vascular disease of lower extremity    Precordial pain 12/24/2022   S/P AAA (abdominal aortic aneurysm) repair 11/17/2023   S/P AAA repair using bifurcation graft 11/17/2023    Past Surgical History:  Procedure Laterality Date   ABDOMINAL AORTIC ENDOVASCULAR STENT GRAFT Bilateral 11/17/2023   Procedure: INSERTION, ENDOVASCULAR STENT GRAFT, AORTA, ABDOMINAL;  Surgeon: Gretta Lonni PARAS, MD;  Location: [facility] OR;  Service: Vascular;  Laterality: Bilateral;   COLONOSCOPY     DUPUYTREN CONTRACTURE RELEASE Right 04/05/2021   Procedure: EXCISION DUPUYTREN'S CONTRACTURES RIGHT HAND, LONG FINGER AND THUMB/INDEX WEBSPACE;  Surgeon: Murrell Drivers, MD;  Location: [facility];  Service: Orthopedics;  Laterality: Right;   HAND SURGERY Bilateral    IR RADIOLOGIST EVAL & MGMT  11/06/2021   TOTAL HIP ARTHROPLASTY Left 2024   ULTRASOUND GUIDANCE FOR VASCULAR ACCESS Bilateral 11/17/2023   Procedure: ULTRASOUND GUIDANCE, FOR VASCULAR ACCESS;  Surgeon: Gretta Lonni PARAS,  MD;  Location: [facility] OR;  Service: Vascular;  Laterality: Bilateral;    Current Medications: Current Meds  Medication Sig   albuterol  (VENTOLIN  HFA) 108 (90 Base) MCG/ACT inhaler Inhale 2 puffs into the lungs daily.   amLODipine -olmesartan  (AZOR ) 10-40 MG tablet Take 1 tablet by mouth daily.   aspirin  81 MG EC tablet Take 81 mg by mouth daily.   atorvastatin  (LIPITOR) 40 MG tablet Take 40 mg by mouth daily.   carvedilol  (COREG ) 3.125 MG  tablet Take 1 tablet (3.125 mg total) by mouth 2 (two) times daily.   clopidogrel  (PLAVIX ) 75 MG tablet Take 75 mg by mouth daily.   doxazosin  (CARDURA ) 1 MG tablet Take 1 mg by mouth at bedtime.   fluticasone  furoate-vilanterol (BREO ELLIPTA ) 100-25 MCG/ACT AEPB Inhale 2 puffs into the lungs daily.   Fluticasone -Umeclidin-Vilant (TRELEGY ELLIPTA) 100-62.5-25 MCG/ACT AEPB Inhale into the lungs.   hydrochlorothiazide  (HYDRODIURIL ) 25 MG tablet Take 1 tablet (25 mg total) by mouth daily.   oxyCODONE  (ROXICODONE ) 5 MG immediate release tablet Take 1 tablet (5 mg total) by mouth every 6 (six) hours as needed for severe pain (pain score 7-10).   STIOLTO RESPIMAT 2.5-2.5 MCG/ACT AERS Inhale 2 puffs into the lungs daily.   traZODone  (DESYREL ) 50 MG tablet Take 100 mg by mouth at bedtime as needed.   [DISCONTINUED] carvedilol  (COREG ) 12.5 MG tablet Take 1 tablet (12.5 mg total) by mouth 2 (two) times daily with a meal.     Allergies:   Patient has no known allergies.   Social History   Socioeconomic History   Marital status: Divorced    Spouse name: Not on file   Number of children: Not on file   Years of education: Not on file   Highest education level: Not on file  Occupational History   Not on file  Tobacco Use   Smoking status: Every Day    Current packs/day: 1.50    Types: Cigarettes   Smokeless tobacco: Former   Tobacco comments:    1 PPD  Vaping Use   Vaping status: Never Used  Substance and Sexual Activity   Alcohol use: Yes    Alcohol/week: 7.0 standard drinks of alcohol    Types: 7 Shots of liquor per week    Comment: drinks 1 burbon nightly   Drug use: Never   Sexual activity: Yes  Other Topics Concern   Not on file  Social History Narrative   Not on file   Social Drivers of Health   Financial Resource Strain: Not on file  Food Insecurity: No Food Insecurity (11/17/2023)   Hunger Vital Sign    Worried About Running Out of Food in the Last Year: Never true    Ran  Out of Food in the Last Year: Never true  Transportation Needs: No Transportation Needs (11/17/2023)   PRAPARE - Administrator, Civil Service (Medical): No    Lack of Transportation (Non-Medical): No  Physical Activity: Not on file  Stress: Not on file  Social Connections: Unknown (11/17/2023)   Social Connection and Isolation Panel    Frequency of Communication with Friends and Family: Patient unable to answer    Frequency of Social Gatherings with Friends and Family: Once a week    Attends Religious Services: Never    Database Administrator or Organizations: No    Attends Banker Meetings: Never    Marital Status: Living with partner     Family History: The patient's family  history includes Cancer in his father and mother; Heart disease in his mother. ROS:   Please see the history of present illness.    All 14 point review of systems negative except as described per history of present illness.  EKGs/Labs/Other Studies Reviewed:    The following studies were reviewed today:   EKG:       Recent Labs: 11/12/2023: ALT 15 11/17/2023: Magnesium 1.8 11/18/2023: BUN 10; Creatinine, Ser 0.89; Hemoglobin 11.6; Platelets 203; Potassium 4.2; Sodium 136  Recent Lipid Panel No results found for: CHOL, TRIG, HDL, CHOLHDL, VLDL, LDLCALC, LDLDIRECT  Physical Exam:    VS:  BP 128/74   Pulse 73   Ht 6' 2 (1.88 m)   Wt 194 lb 6.4 oz (88.2 kg)   SpO2 90%   BMI 24.96 kg/m     Wt Readings from Last 3 Encounters:  01/20/24 194 lb 6.4 oz (88.2 kg)  12/23/23 198 lb (89.8 kg)  11/17/23 198 lb (89.8 kg)     GENERAL:  Well nourished, well developed in no acute distress NECK: No JVD; No carotid bruits CARDIAC: RRR, S1 and S2 present, no murmurs, no rubs, no gallops CHEST:  Clear to auscultation without rales, wheezing or rhonchi  Extremities: No pitting pedal edema. Pulses bilaterally symmetric with radial 2+ and dorsalis pedis 2+ NEUROLOGIC:  Alert  and oriented x 3  Medication Adjustments/Labs and Tests Ordered: Current medicines are reviewed at length with the patient today.  Concerns regarding medicines are outlined above.  No orders of the defined types were placed in this encounter.  Meds ordered this encounter  Medications   carvedilol  (COREG ) 3.125 MG tablet    Sig: Take 1 tablet (3.125 mg total) by mouth 2 (two) times daily.    Dispense:  180 tablet    Refill:  3    Signed, Damontay Alred reddy Neve Branscomb, MD, MPH, [facility]. 01/20/2024 4:44 PM    [facility]

## 2024-01-20 NOTE — Assessment & Plan Note (Signed)
 Remains on Plavix  75 mg once daily given his peripheral vascular disease history. Continue atorvastatin  40 mg once daily.

## 2024-01-26 DIAGNOSIS — G4700 Insomnia, unspecified: Secondary | ICD-10-CM | POA: Diagnosis not present

## 2024-01-26 DIAGNOSIS — I10 Essential (primary) hypertension: Secondary | ICD-10-CM | POA: Diagnosis not present

## 2024-01-26 DIAGNOSIS — I7143 Infrarenal abdominal aortic aneurysm, without rupture: Secondary | ICD-10-CM | POA: Diagnosis not present

## 2024-01-26 DIAGNOSIS — R972 Elevated prostate specific antigen [PSA]: Secondary | ICD-10-CM | POA: Diagnosis not present

## 2024-01-26 DIAGNOSIS — D649 Anemia, unspecified: Secondary | ICD-10-CM | POA: Diagnosis not present

## 2024-01-26 DIAGNOSIS — Z6824 Body mass index (BMI) 24.0-24.9, adult: Secondary | ICD-10-CM | POA: Diagnosis not present

## 2024-01-26 DIAGNOSIS — J439 Emphysema, unspecified: Secondary | ICD-10-CM | POA: Diagnosis not present

## 2024-02-09 ENCOUNTER — Other Ambulatory Visit: Payer: Self-pay

## 2024-06-15 ENCOUNTER — Ambulatory Visit: Admitting: Vascular Surgery

## 2024-06-15 ENCOUNTER — Other Ambulatory Visit (HOSPITAL_COMMUNITY)
